# Patient Record
Sex: Male | Born: 1945
Health system: Southern US, Community
[De-identification: ages and names within clinical notes are randomized; demographics above are authoritative.]

## PROBLEM LIST (undated history)

## (undated) DIAGNOSIS — K469 Unspecified abdominal hernia without obstruction or gangrene: Secondary | ICD-10-CM

## (undated) DIAGNOSIS — M1990 Unspecified osteoarthritis, unspecified site: Secondary | ICD-10-CM

## (undated) DIAGNOSIS — C801 Malignant (primary) neoplasm, unspecified: Secondary | ICD-10-CM

## (undated) HISTORY — PX: HERNIA REPAIR: SHX51

## (undated) HISTORY — DX: Unspecified osteoarthritis, unspecified site: M19.90

## (undated) HISTORY — DX: Unspecified abdominal hernia without obstruction or gangrene: K46.9

## (undated) HISTORY — DX: Malignant (primary) neoplasm, unspecified: C80.1

---

## 2000-11-28 ENCOUNTER — Encounter (INDEPENDENT_AMBULATORY_CARE_PROVIDER_SITE_OTHER): Payer: Self-pay | Admitting: Specialist

## 2000-11-28 ENCOUNTER — Ambulatory Visit (HOSPITAL_BASED_OUTPATIENT_CLINIC_OR_DEPARTMENT_OTHER): Admission: RE | Admit: 2000-11-28 | Discharge: 2000-11-28 | Payer: Self-pay | Admitting: Plastic Surgery

## 2003-04-02 ENCOUNTER — Ambulatory Visit (HOSPITAL_COMMUNITY): Admission: RE | Admit: 2003-04-02 | Discharge: 2003-04-02 | Payer: Self-pay | Admitting: Gastroenterology

## 2003-05-28 ENCOUNTER — Encounter: Payer: Self-pay | Admitting: Internal Medicine

## 2003-05-28 ENCOUNTER — Encounter: Admission: RE | Admit: 2003-05-28 | Discharge: 2003-05-28 | Payer: Self-pay | Admitting: Internal Medicine

## 2003-12-26 ENCOUNTER — Encounter: Admission: RE | Admit: 2003-12-26 | Discharge: 2003-12-26 | Payer: Self-pay | Admitting: Internal Medicine

## 2009-07-21 ENCOUNTER — Ambulatory Visit: Admission: RE | Admit: 2009-07-21 | Discharge: 2009-08-05 | Payer: Self-pay | Admitting: Radiation Oncology

## 2009-08-03 ENCOUNTER — Encounter: Admission: RE | Admit: 2009-08-03 | Discharge: 2009-08-03 | Payer: Self-pay | Admitting: Internal Medicine

## 2009-08-07 ENCOUNTER — Encounter: Admission: RE | Admit: 2009-08-07 | Discharge: 2009-08-07 | Payer: Self-pay | Admitting: Internal Medicine

## 2009-09-17 ENCOUNTER — Ambulatory Visit: Admission: RE | Admit: 2009-09-17 | Discharge: 2009-11-18 | Payer: Self-pay | Admitting: Radiation Oncology

## 2009-10-21 HISTORY — PX: INSERTION PROSTATE RADIATION SEED: SUR718

## 2009-11-02 ENCOUNTER — Ambulatory Visit (HOSPITAL_BASED_OUTPATIENT_CLINIC_OR_DEPARTMENT_OTHER): Admission: RE | Admit: 2009-11-02 | Discharge: 2009-11-02 | Payer: Self-pay | Admitting: Urology

## 2009-12-15 ENCOUNTER — Ambulatory Visit: Admission: RE | Admit: 2009-12-15 | Discharge: 2009-12-25 | Payer: Self-pay | Admitting: Radiation Oncology

## 2011-02-22 LAB — CBC
MCHC: 33.8 g/dL (ref 30.0–36.0)
Platelets: 176 10*3/uL (ref 150–400)
RBC: 4.41 MIL/uL (ref 4.22–5.81)

## 2011-02-22 LAB — COMPREHENSIVE METABOLIC PANEL
ALT: 17 U/L (ref 0–53)
AST: 18 U/L (ref 0–37)
Albumin: 3.7 g/dL (ref 3.5–5.2)
CO2: 26 mEq/L (ref 19–32)
Calcium: 8.9 mg/dL (ref 8.4–10.5)
Creatinine, Ser: 1.01 mg/dL (ref 0.4–1.5)
GFR calc Af Amer: >60 mL/min (ref >60)
GFR calc non Af Amer: >60 mL/min (ref >60)
Sodium: 140 mEq/L (ref 135–145)
Total Protein: 6.2 g/dL (ref 6.0–8.3)

## 2011-02-22 LAB — APTT: aPTT: 29 seconds (ref 24–37)

## 2011-04-08 NOTE — Op Note (Signed)
[place]  Patient:    [name], [name]                        MRN: [id number] Proc. Date: 11/28/00 Attending:  Mary A. Contogiannis, M.D. CC:         Hope M. Danella Deis, M.D.   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  SURGEON:  Mary A. Contogiannis, M.D.  ANESTHESIA:  Lidocaine 1% with epinephrine.  COMPLICATIONS:  None.  INDICATIONS:  The patient is a 65 year old Caucasian male who was referred to my office by Dr. Theodora Blow. Danella Deis for re-excision of a squamous cell carcinoma in situ of the left ear.  She biopsied the lesion, and the pathology indicated that it was a squamous cell carcinoma in situ.  He requested that I proceed with a re-excision at this time.  I will obtain an intraoperative frozen section diagnosis with the pathologist for consultation regarding excision of the lesion with clear margins.  DESCRIPTION OF PROCEDURE:  The patient was brought to the operating room and placed on the operating room table in the supine position.  The skin of the left face and ear were prepped with Betadine and draped in a sterile fashion. The skin and subcutaneous tissues were then injected with 1% lidocaine with epinephrine around the area of the squamous cell carcinoma in situ of the left ear.  After adequate hemostasis and anesthesia had taken effect, the procedure was begun.  The skin cancer was then excised in an elliptical fashion, full-thickness through the skin, allowing a few mm of what appeared to clinically be clear and normal skin around the lesion.  The specimen was carried full-thickness through the skin into the subcutaneous fatty layer.  It was then marked at the 12 oclock position  and sent to the pathology laboratory for intraoperative frozen section diagnosis.  After consulting with the pathologist, he felt that all of the squamous cell carcinoma had been removed, and that all margins were free of tumor.  At this point, the skin edges were then undermined for easy closure.  Meticulous hemostasis was obtained.  The skin edges were then closed using #5-0 Monocryl interrupted subdermal layer, followed by a #6-0 Prolene running baseball stitch on the skin.  The incision was dressed with bacitracin ointment and a Band-Aid. There were no complications.  The patient tolerated the procedure well.  The final needle and sponge counts were reported to be correct at the end of the case.  The patient was then discharged home in stable condition, and will come to the office tomorrow for a follow-up appointment. DD:  11/28/00 TD:  11/28/00 Job: [id number]

## 2011-04-08 NOTE — Op Note (Signed)
   NAMECRUISE, BAUMGARDNER                    ACCOUNT NO.:  [id number]   MEDICAL RECORD NO.:  [id number]                   PATIENT TYPE:  AMB   LOCATION:  ENDO                                 FACILITY:  [facility]   PHYSICIAN:  Danise Edge, M.D.                DATE OF BIRTH:  [birth year]   DATE OF PROCEDURE:  04/02/2003  DATE OF DISCHARGE:                                 OPERATIVE REPORT   PROCEDURE:  Screening colonoscopy.   INDICATIONS FOR PROCEDURE:  Mr. Taeshaun Rames is a 65 year old male  born 24-May-1946. Mr. Theiler father was diagnosed with colon cancer at  age 28 and died of colon cancer at age 6. Mr. Wigley is scheduled to  undergo his second screening colonoscopy with polypectomy to prevent colon  cancer.   ENDOSCOPIST:  Charolett Bumpers, M.D.   PREMEDICATION:  Versed 7.5 mg, Demerol 50 mg .   DESCRIPTION OF PROCEDURE:  After obtaining informed consent, Mr. Pickrell  was  placed in the left lateral decubitus position. I administered intravenous  Demerol and intravenous Versed to achieve conscious sedation for the  procedure. The patient's blood pressure, oxygen saturation and cardiac  rhythm were monitored throughout the procedure and documented in the medical  record.   Anal inspection was normal. Digital rectal exam revealed a nonnodular  prostate. The Olympus adult colonoscope was introduced into the rectum and  easily advanced to the cecum. Colonic preparation for the exam today was  excellent.   RECTUM:  Normal.   SIGMOID COLON AND DESCENDING COLON:  Normal.   SPLENIC FLEXURE:  Normal.   TRANSVERSE COLON:  Normal.   HEPATIC FLEXURE:  Normal.   ASCENDING COLON:  Normal.   CECUM AND ILEOCECAL VALVE:  Normal.   ASSESSMENT:  Normal screening proctocolonoscopy to the cecum.   RECOMMENDATIONS:  Repeat colonoscopy in five years.                                              Danise Edge, M.D.   MJ/MEDQ  D:  04/02/2003  T:  04/02/2003  Job:   [id number]

## 2011-04-08 NOTE — Op Note (Signed)
[place]  Patient:    KARLA, VINES                        MRN: [id number] Proc. Date: 11/28/00 Attending:  Mary A. Contogiannis, M.D.                           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  PROCEDURE:  Excision of 0.5 cm squamous cell carcinoma in situ DD:  11/28/00 TD:  11/28/00 Job: [id number]

## 2011-04-22 HISTORY — PX: KNEE CARTILAGE SURGERY: SHX688

## 2011-09-09 ENCOUNTER — Emergency Department (HOSPITAL_COMMUNITY)
Admission: EM | Admit: 2011-09-09 | Discharge: 2011-09-09 | Disposition: A | Discharge status: Home / Self Care | Payer: Medicare Other | Attending: Emergency Medicine | Admitting: Emergency Medicine

## 2011-09-09 DIAGNOSIS — Z8546 Personal history of malignant neoplasm of prostate: Secondary | ICD-10-CM | POA: Insufficient documentation

## 2011-09-09 DIAGNOSIS — K4090 Unilateral inguinal hernia, without obstruction or gangrene, not specified as recurrent: Secondary | ICD-10-CM | POA: Insufficient documentation

## 2011-09-09 DIAGNOSIS — R1032 Left lower quadrant pain: Secondary | ICD-10-CM

## 2011-09-10 NOTE — Consult Note (Signed)
[name], RABORN NO.:  [id number]  MEDICAL RECORD NO.:  [id number]  LOCATION:  WLED                         FACILITY:  [facility]  PHYSICIAN:  Adolph Pollack, M.D.DATE OF BIRTH:  05/13/1946  DATE OF CONSULTATION:  09/09/2011 DATE OF DISCHARGE:  09/09/2011                                CONSULTATION   REQUESTING PHYSICIAN:  Lavinia Sharps, NP  REASON:  Incarcerated left inguinal hernia.  HISTORY:  Mr. Poulson is a 65 year old male who has been having intermittent pains in the left groin.  About 3 months ago, he had a cough and felt a straining sensation in the left groin.  Two weeks ago, he had some left lower quadrant abdominal pain that then radiated down to the left groin area.  He had some swelling that would come and go. It was not there in the morning, but as he went throughout the day, it came more pronounced.  About a week ago, he had a severe episode of sharp left groin pain and swelling.  That has since improved.  He is having normal bowel movements and is not having any problems with urinating.  However, he still has some discomfort and swelling in the left groin.  He went to saw Mee Hives, nurse practitioner today and there was concern for an incarcerated hernia in left inguinal area, and he was sent to the emergency department and we were asked to see him. He currently is sitting comfortably in the emergency room and is in no acute distress.  PAST MEDICAL HISTORY: 1. Mild sleep apnea. 2. Nephrolithiasis. 3. Arthritis. 4. Prostate cancer. 5. BPH.  PREVIOUS OPERATIONS: 1. Left knee arthroscopy. 2. Seed implantation of the prostate gland.  ALLERGIES:  None.  MEDICATIONS: 1. Multivitamin. 2. Ibuprofen. 3. Tamsulosin. 4. Levitra. 5. Fish oil. 6. Dicyclomine (was given this by Urgent Care as they thought he might     have some diverticulitis).  SOCIAL HISTORY:  He is married.  His wife just had a hysterectomy.  He travels a lot  for work.  Denies tobacco or alcohol use.  FAMILY HISTORY:  Not contributory to this situation.  REVIEW OF SYSTEMS:  GENERAL:  He has felt well other than having these left groin pain episodes.  CARDIOVASCULAR:  He denies any heart disease, hypertension.  PULMONARY:  He denies any current cough, asthma.  GI:  He denies known diverticulitis, hepatitis, or peptic ulcer disease.  GU: He does have 3 times nocturia, although he states he does not have to strain to urinate.  HEMATOLOGIC:  No bleeding source or blood clots. NEUROLOGIC:  No strokes or seizures.  PHYSICAL EXAMINATION:  GENERAL:  Well-developed, well-nourished male in no acute distress, pleasant and cooperative. VITAL SIGNS:  Temperature is 98.4 degrees, blood pressure is 131/66, pulse 77, respiratory rate 20. RESPIRATORY:  Breath sounds are equal and clear.  Respirations unlabored. CARDIOVASCULAR:  Regular rate and rhythm.  No murmurs. ABDOMEN:  Soft.  No umbilical hernia present.  No tenderness. GU:  There is a left inguinal bulge that is mildly sensitive, but it is reducible.  Right inguinal floor is solid.  No testicular lesions.  IMPRESSION:  Symptomatic simple left  inguinal hernia.  No evidence of incarceration.  PLAN:  I had a long discussion with him and recommended an open left inguinal hernia with mesh.  Until that time, I told him that he should be on very light activities with a 10-pound weight restriction.  I told him if it was symptomatic ideally, he would have this done within 1 to 2 weeks.  I gave him my number to call and schedule surgery after he has discussed this with his wife and his work.  Also, of note is he has appointment with Dr. Danise Edge on Monday and Dr. Theressa Millard next week as well.  We discussed the procedure of umbilical hernia repair at length.  The risks include, but not limited to bleeding, infection, wound healing problems, anesthesia, recurrence, accidental injury to  intraabdominal organs, neuropathic pain, testicular ischemia, and urinary retention as well as the recurrence rate.  He seems to understand all this.  He is interested in having it repaired and said he will call the office and schedule it.     Adolph Pollack, M.D.     Kari Baars  D:  09/09/2011  T:  09/09/2011  Job:  [id number]  cc:   [address] [place] Fax: [phone]  Theressa Millard, M.D. Fax: [phone]  Electronically Signed by Avel Peace M.D. on 09/10/2011 01:07:33 AM

## 2011-09-13 ENCOUNTER — Ambulatory Visit (INDEPENDENT_AMBULATORY_CARE_PROVIDER_SITE_OTHER): Payer: Medicare Other | Admitting: General Surgery

## 2011-09-13 ENCOUNTER — Ambulatory Visit (INDEPENDENT_AMBULATORY_CARE_PROVIDER_SITE_OTHER): Payer: BC Managed Care – PPO | Admitting: General Surgery

## 2011-09-13 ENCOUNTER — Encounter (INDEPENDENT_AMBULATORY_CARE_PROVIDER_SITE_OTHER): Payer: Self-pay | Admitting: General Surgery

## 2011-09-13 VITALS — BP 108/76 | HR 64 | Temp 97.0°F | Resp 20 | Ht 72.0 in | Wt 178.2 lb

## 2011-09-13 DIAGNOSIS — K4090 Unilateral inguinal hernia, without obstruction or gangrene, not specified as recurrent: Secondary | ICD-10-CM | POA: Insufficient documentation

## 2011-09-13 NOTE — Progress Notes (Signed)
[name] is here for a preoperative visit. He is interested in scheduling his left inguinal hernia. I discussed with an open repair versus laparoscopic and recommended the open repair. He is in agreement with this. He has been given literature on the repair as well as aftercare instructions. We will proceed with scheduling his surgery today.

## 2011-10-05 ENCOUNTER — Encounter (HOSPITAL_COMMUNITY): Payer: Self-pay | Admitting: Pharmacy Technician

## 2011-10-06 ENCOUNTER — Encounter (HOSPITAL_COMMUNITY)
Admission: RE | Admit: 2011-10-06 | Discharge: 2011-10-06 | Disposition: A | Discharge status: Home / Self Care | Payer: Medicare Other | Source: Ambulatory Visit | Attending: General Surgery | Admitting: General Surgery

## 2011-10-06 ENCOUNTER — Other Ambulatory Visit: Payer: Self-pay

## 2011-10-06 ENCOUNTER — Encounter (HOSPITAL_COMMUNITY): Payer: Self-pay

## 2011-10-06 LAB — CBC
HCT: 44.3 % (ref 39.0–52.0)
Hemoglobin: 15.4 g/dL (ref 13.0–17.0)
MCH: 30.1 pg (ref 26.0–34.0)
MCHC: 34.8 g/dL (ref 30.0–36.0)
RDW: 12.9 % (ref 11.5–15.5)

## 2011-10-06 LAB — COMPREHENSIVE METABOLIC PANEL
BUN: 15 mg/dL (ref 6–23)
Calcium: 10.2 mg/dL (ref 8.4–10.5)
Creatinine, Ser: 0.93 mg/dL (ref 0.50–1.35)
GFR calc Af Amer: >90 mL/min (ref >90)
Glucose, Bld: 73 mg/dL (ref 70–99)
Total Protein: 7.2 g/dL (ref 6.0–8.3)

## 2011-10-06 LAB — DIFFERENTIAL
Eosinophils Absolute: 0.1 10*3/uL (ref 0.0–0.7)
Lymphocytes Relative: 24 % (ref 12–46)
Lymphs Abs: 1.3 10*3/uL (ref 0.7–4.0)
Neutro Abs: 3.5 10*3/uL (ref 1.7–7.7)
Neutrophils Relative %: 65 % (ref 43–77)

## 2011-10-06 LAB — PROTIME-INR
INR: 1.01 (ref 0.00–1.49)
Prothrombin Time: 13.5 seconds (ref 11.6–15.2)

## 2011-10-06 LAB — SURGICAL PCR SCREEN: MRSA, PCR: NEGATIVE

## 2011-10-06 MED ORDER — CEFAZOLIN SODIUM 1-5 GM-% IV SOLN
1.0000 g | INTRAVENOUS | Status: AC
  Administered 2011-10-07: 1 g via INTRAVENOUS
  Filled 2011-10-06 (×2): qty 50

## 2011-10-06 NOTE — Pre-Procedure Instructions (Deleted)
20 FODAY CONE  10/06/2011   Your procedure is scheduled on:  10-07-2011  Report to [name] at 11:00 AM  Call this number if you have problems the morning of surgery: [phone]   Remember:   Do not eat food:After Midnight.  Do not drink clear liquids: 4 Hours before arrival  TEA,SODA,BROTH,BLACK COFFEE,APPLE JUICE, GRAPE JUICE UNTIL 7:00 AM.  Take these medicines the morning of surgery with A SIP OF WATER: FLOMAX   Do not wear jewelry, make-up or nail polish.  Do not wear lotions, powders, or perfumes. You may wear deodorant.  Do not shave 48 hours prior to surgery.  Do not bring valuables to the hospital.  Contacts, dentures or bridgework may not be worn into surgery.  Leave suitcase in the car. After surgery it may be brought to your room.  For patients admitted to the hospital, checkout time is 11:00 AM the day of discharge.   Patients discharged the day of surgery will not be allowed to drive home.  Name and phone number of your driver:  Special Instructions: CHG Shower Use Special Wash: 1/2 bottle night before surgery and 1/2 bottle morning of surgery.   Please read over the following fact sheets that you were given: Coughing and Deep Breathing, MRSA Information and Surgical Site Infection Prevention

## 2011-10-06 NOTE — Pre-Procedure Instructions (Signed)
20 [name]  10/06/2011   Your procedure is scheduled on:10-07-2011  Report to [name] at 11 AM.  Call this number if you have problems the morning of surgery: [phone]   Remember:   Do not eat food:After Midnight.  Do not drink clear liquids: 4 Hours before arrival.  TEA,SODA,BROTH,BLACK COFFEE,APPLE JUICE,GRAPE JUICE BEFORE 7 AM  Take these medicines the morning of surgery with A SIP OF WATER FLOMAX   Do not wear jewelry, make-up or nail polish.  Do not wear lotions, powders, or perfumes. You may wear deodorant.  Do not shave 48 hours prior to surgery.  Do not bring valuables to the hospital.  Contacts, dentures or bridgework may not be worn into surgery.  Leave suitcase in the car. After surgery it may be brought to your room.  For patients admitted to the hospital, checkout time is 11:00 AM the day of discharge.   Patients discharged the day of surgery will not be allowed to drive home.  Name and phone number of your driver:   Special Instructions: CHG Shower Use Special Wash: 1/2 bottle night before surgery and 1/2 bottle morning of surgery.   Please read over the following fact sheets that you were given: Coughing and Deep Breathing, MRSA Information and Surgical Site Infection Prevention

## 2011-10-07 ENCOUNTER — Ambulatory Visit (HOSPITAL_COMMUNITY)
Admission: RE | Admit: 2011-10-07 | Discharge: 2011-10-07 | Disposition: A | Discharge status: Home / Self Care | Payer: Medicare Other | Source: Ambulatory Visit | Attending: General Surgery | Admitting: General Surgery

## 2011-10-07 ENCOUNTER — Encounter (HOSPITAL_COMMUNITY): Payer: Self-pay | Admitting: *Deleted

## 2011-10-07 ENCOUNTER — Encounter (HOSPITAL_COMMUNITY): Payer: Self-pay | Admitting: Anesthesiology

## 2011-10-07 ENCOUNTER — Other Ambulatory Visit (INDEPENDENT_AMBULATORY_CARE_PROVIDER_SITE_OTHER): Payer: Self-pay | Admitting: General Surgery

## 2011-10-07 ENCOUNTER — Ambulatory Visit (HOSPITAL_COMMUNITY): Payer: Medicare Other | Admitting: Anesthesiology

## 2011-10-07 ENCOUNTER — Encounter (HOSPITAL_COMMUNITY)
Admission: RE | Disposition: A | Discharge status: Home / Self Care | Payer: Self-pay | Source: Ambulatory Visit | Attending: General Surgery

## 2011-10-07 DIAGNOSIS — K4090 Unilateral inguinal hernia, without obstruction or gangrene, not specified as recurrent: Secondary | ICD-10-CM

## 2011-10-07 DIAGNOSIS — Z01818 Encounter for other preprocedural examination: Secondary | ICD-10-CM | POA: Insufficient documentation

## 2011-10-07 DIAGNOSIS — Z01812 Encounter for preprocedural laboratory examination: Secondary | ICD-10-CM | POA: Insufficient documentation

## 2011-10-07 DIAGNOSIS — Z01810 Encounter for preprocedural cardiovascular examination: Secondary | ICD-10-CM | POA: Insufficient documentation

## 2011-10-07 HISTORY — PX: INGUINAL HERNIA REPAIR: SHX194

## 2011-10-07 SURGERY — REPAIR, HERNIA, INGUINAL, ADULT
Anesthesia: General | Site: Groin | Laterality: Left | Wound class: Clean

## 2011-10-07 MED ORDER — MEPERIDINE HCL 25 MG/ML IJ SOLN
6.2500 mg | INTRAMUSCULAR | Status: DC | PRN
Start: 2011-10-07 — End: 2011-10-07

## 2011-10-07 MED ORDER — PROMETHAZINE HCL 25 MG/ML IJ SOLN
12.5000 mg | Freq: Four times a day (QID) | INTRAMUSCULAR | Status: DC | PRN
  Filled 2011-10-07: qty 1

## 2011-10-07 MED ORDER — HYDROMORPHONE HCL PF 1 MG/ML IJ SOLN
0.2500 mg | INTRAMUSCULAR | Status: DC | PRN
  Administered 2011-10-07: 0.5 mg via INTRAVENOUS

## 2011-10-07 MED ORDER — SODIUM CHLORIDE 0.9 % IR SOLN
Status: DC | PRN
  Administered 2011-10-07: 1

## 2011-10-07 MED ORDER — BUPIVACAINE LIPOSOME 1.3 % IJ SUSP
20.0000 mL | Freq: Once | INTRAMUSCULAR | Status: AC
  Administered 2011-10-07: 30 mL
  Filled 2011-10-07: qty 20

## 2011-10-07 MED ORDER — MIDAZOLAM HCL 5 MG/5ML IJ SOLN
INTRAMUSCULAR | Status: DC | PRN
  Administered 2011-10-07: 2 mg via INTRAVENOUS

## 2011-10-07 MED ORDER — ONDANSETRON HCL 4 MG/2ML IJ SOLN
4.0000 mg | Freq: Four times a day (QID) | INTRAMUSCULAR | Status: DC | PRN
  Filled 2011-10-07: qty 2

## 2011-10-07 MED ORDER — LACTATED RINGERS IV SOLN
INTRAVENOUS | Status: DC | PRN
  Administered 2011-10-07 (×2): via INTRAVENOUS

## 2011-10-07 MED ORDER — MORPHINE SULFATE 2 MG/ML IJ SOLN: 0.0500 mg/kg | INTRAMUSCULAR | Status: DC | PRN

## 2011-10-07 MED ORDER — MIDAZOLAM HCL 2 MG/2ML IJ SOLN
0.5000 mg | Freq: Once | INTRAMUSCULAR | Status: DC | PRN
Start: 2011-10-07 — End: 2011-10-07

## 2011-10-07 MED ORDER — OXYCODONE-ACETAMINOPHEN 5-325 MG PO TABS: 1.0000 | ORAL_TABLET | ORAL | Status: DC | PRN

## 2011-10-07 MED ORDER — PHENYLEPHRINE HCL 10 MG/ML IJ SOLN
INTRAMUSCULAR | Status: DC | PRN
  Administered 2011-10-07 (×2): 80 ug via INTRAVENOUS

## 2011-10-07 MED ORDER — PROMETHAZINE HCL 25 MG/ML IJ SOLN: 6.2500 mg | INTRAMUSCULAR | Status: DC | PRN

## 2011-10-07 MED ORDER — FENTANYL CITRATE 0.05 MG/ML IJ SOLN
INTRAMUSCULAR | Status: DC | PRN
  Administered 2011-10-07: 100 ug via INTRAVENOUS
  Administered 2011-10-07: 150 ug via INTRAVENOUS

## 2011-10-07 MED ORDER — PROPOFOL 10 MG/ML IV EMUL
INTRAVENOUS | Status: DC | PRN
  Administered 2011-10-07: 200 mg via INTRAVENOUS

## 2011-10-07 SURGICAL SUPPLY — 52 items
APL SKNCLS STERI-STRIP NONHPOA (GAUZE/BANDAGES/DRESSINGS) ×1
BENZOIN TINCTURE PRP APPL 2/3 (GAUZE/BANDAGES/DRESSINGS) ×2 IMPLANT
BLADE SURG 10 STRL SS (BLADE) ×2 IMPLANT
BLADE SURG 15 STRL LF DISP TIS (BLADE) ×1 IMPLANT
BLADE SURG 15 STRL SS (BLADE) ×2
BLADE SURG ROTATE 9660 (MISCELLANEOUS) IMPLANT
CHLORAPREP W/TINT 26ML (MISCELLANEOUS) ×2 IMPLANT
CLOTH BEACON ORANGE TIMEOUT ST (SAFETY) ×2 IMPLANT
COVER SURGICAL LIGHT HANDLE (MISCELLANEOUS) ×2 IMPLANT
DRAIN PENROSE 1/2X12 LTX STRL (WOUND CARE) ×1 IMPLANT
DRAPE INCISE IOBAN 66X45 STRL (DRAPES) ×2 IMPLANT
DRAPE LAPAROTOMY TRNSV 102X78 (DRAPE) ×2 IMPLANT
DRAPE UTILITY 15X26 W/TAPE STR (DRAPE) ×4 IMPLANT
DRESSING TELFA 8X3 (GAUZE/BANDAGES/DRESSINGS) ×2 IMPLANT
DRSG OPSITE 6X11 MED (GAUZE/BANDAGES/DRESSINGS) ×2 IMPLANT
ELECT CAUTERY BLADE 6.4 (BLADE) ×2 IMPLANT
ELECT REM PT RETURN 9FT ADLT (ELECTROSURGICAL) ×2
ELECTRODE REM PT RTRN 9FT ADLT (ELECTROSURGICAL) ×1 IMPLANT
GLOVE BIO SURGEON STRL SZ7.5 (GLOVE) ×1 IMPLANT
GLOVE BIOGEL PI IND STRL 7.0 (GLOVE) IMPLANT
GLOVE BIOGEL PI IND STRL 7.5 (GLOVE) IMPLANT
GLOVE BIOGEL PI IND STRL 8 (GLOVE) ×1 IMPLANT
GLOVE BIOGEL PI INDICATOR 7.0 (GLOVE) ×1
GLOVE BIOGEL PI INDICATOR 7.5 (GLOVE) ×2
GLOVE BIOGEL PI INDICATOR 8 (GLOVE) ×1
GLOVE ECLIPSE 8.0 STRL XLNG CF (GLOVE) ×2 IMPLANT
GLOVE SS BIOGEL STRL SZ 6.5 (GLOVE) IMPLANT
GLOVE SUPERSENSE BIOGEL SZ 6.5 (GLOVE) ×1
GOWN STRL NON-REIN LRG LVL3 (GOWN DISPOSABLE) ×6 IMPLANT
KIT BASIN OR (CUSTOM PROCEDURE TRAY) ×2 IMPLANT
KIT ROOM TURNOVER OR (KITS) ×2 IMPLANT
MESH HERNIA 3X6 (Mesh General) ×1 IMPLANT
NDL HYPO 25GX1X1/2 BEV (NEEDLE) ×1 IMPLANT
NEEDLE HYPO 25GX1X1/2 BEV (NEEDLE) ×2 IMPLANT
NS IRRIG 1000ML POUR BTL (IV SOLUTION) ×2 IMPLANT
PACK SURGICAL SETUP 50X90 (CUSTOM PROCEDURE TRAY) ×2 IMPLANT
PAD ARMBOARD 7.5X6 YLW CONV (MISCELLANEOUS) ×2 IMPLANT
PENCIL BUTTON HOLSTER BLD 10FT (ELECTRODE) ×2 IMPLANT
SPECIMEN JAR SMALL (MISCELLANEOUS) IMPLANT
SPONGE LAP 18X18 X RAY DECT (DISPOSABLE) ×2 IMPLANT
STRIP CLOSURE SKIN 1/2X4 (GAUZE/BANDAGES/DRESSINGS) ×2 IMPLANT
SUT MON AB 4-0 PC3 18 (SUTURE) ×2 IMPLANT
SUT PROLENE 2 0 CT2 30 (SUTURE) ×4 IMPLANT
SUT SILK 2 0 SH (SUTURE) IMPLANT
SUT VIC AB 2-0 SH 18 (SUTURE) ×2 IMPLANT
SUT VIC AB 3-0 SH 27 (SUTURE) ×2
SUT VIC AB 3-0 SH 27XBRD (SUTURE) ×1 IMPLANT
SUT VICRYL AB 3 0 TIES (SUTURE) ×2 IMPLANT
SYR CONTROL 10ML LL (SYRINGE) ×2 IMPLANT
TOWEL OR 17X24 6PK STRL BLUE (TOWEL DISPOSABLE) ×2 IMPLANT
TOWEL OR 17X26 10 PK STRL BLUE (TOWEL DISPOSABLE) ×2 IMPLANT
WATER STERILE IRR 1000ML POUR (IV SOLUTION) IMPLANT

## 2011-10-07 NOTE — Anesthesia Postprocedure Evaluation (Signed)
  Anesthesia Post-op Note  Patient: [name]  Procedure(s) Performed:  HERNIA REPAIR INGUINAL ADULT - Left Inguinal Repair with Mesh  Patient Location: PACU  Anesthesia Type: General  Level of Consciousness: awake  Airway and Oxygen Therapy: Patient Spontanous Breathing  Post-op Pain: mild  Post-op Assessment: Post-op Vital signs reviewed  Post-op Vital Signs: stable  Complications: No apparent anesthesia complications

## 2011-10-07 NOTE — H&P (Signed)
           Patient Information       Patient Name  Sex  DOB  SSN    [name]  Male  04-Apr-1946  [id number]          Transcription       Type  ID  Status  Clydene Fake & P [id number]  Authenticated  Adolph Pollack, MD                 Transcription Text    NAME: AN, SCHNABEL.: [id number]  MEDICAL RECORD NO.: [id number]   PHYSICIAN: Adolph Pollack, M.D.DATE OF BIRTH: 24-Feb-1946   REASON: Left inguinal hernia.   HISTORY: [name] is a 65 year old male who has been having  intermittent pains in the left groin. About 4 months ago, he had a  cough and felt a straining sensation in the left groin.   He had an episode of significant pain about a month ago and was seen in the ED where he was found to have a reducible LIH.  He now presents for elective repair. PAST MEDICAL HISTORY:  1. Mild sleep apnea.  2. Nephrolithiasis.  3. Arthritis.  4. Prostate cancer.  5. BPH.  PREVIOUS OPERATIONS:  1. Left knee arthroscopy.  2. Seed implantation of the prostate gland.  ALLERGIES: None.  MEDICATIONS: Reviewed  SOCIAL HISTORY: He is married. His wife just had a hysterectomy. He  travels a lot for work. Denies tobacco or alcohol use.  FAMILY HISTORY: Not contributory to this situation.  REVIEW OF SYSTEMS: GENERAL: He has felt well other than having these  left groin pain episodes. CARDIOVASCULAR: He denies any heart disease,  hypertension. PULMONARY: He denies any current cough, asthma. GI: He  denies known diverticulitis, hepatitis, or peptic ulcer disease. GU:  He does have 3 times nocturia, although he states he does not have to  strain to urinate. HEMATOLOGIC: No bleeding source or blood clots.  NEUROLOGIC: No strokes or seizures.  PHYSICAL EXAMINATION: GENERAL: Well-developed, well-nourished male in  no acute distress, pleasant and cooperative.  VITAL SIGNS: Temperature is 98.4 degrees, blood pressure is 131/66,  pulse 77, respiratory rate 20.    RESPIRATORY: Breath sounds are equal and clear. Respirations  unlabored.  CARDIOVASCULAR: Regular rate and rhythm. No murmurs.  ABDOMEN: Soft. No umbilical hernia present. No tenderness.  GU: There is a left inguinal bulge that is mildly sensitive, but it is  reducible. Right inguinal floor is solid. No testicular lesions.  IMPRESSION: Symptomatic simple left inguinal hernia. No evidence of  incarceration.  PLAN: Open left inguinal hernia with mesh.  We discussed the procedure of inguinal hernia repair at length. The  risks include, but not limited to bleeding, infection, wound healing  problems, anesthesia, recurrence, accidental injury to intraabdominal  organs, neuropathic pain, testicular ischemia, and urinary retention as  well as the recurrence rate. He seems to understand and agrees. Adolph Pollack, M.D.

## 2011-10-07 NOTE — Transfer of Care (Signed)
Immediate Anesthesia Transfer of Care Note  Patient: [name]  Procedure(s) Performed:  HERNIA REPAIR INGUINAL ADULT - Left Inguinal Repair with Mesh  Patient Location: PACU  Anesthesia Type: General  Level of Consciousness: awake, alert , oriented and patient cooperative  Airway & Oxygen Therapy: Patient Spontanous Breathing and Patient connected to nasal cannula oxygen  Post-op Assessment: Report given to PACU RN, Post -op Vital signs reviewed and stable and Patient moving all extremities  Post vital signs: Reviewed and stable  Complications: No apparent anesthesia complications

## 2011-10-07 NOTE — Op Note (Signed)
Preoperative diagnosis:  Left inguinal hernia.  Postoperative diagnosis:  Same  Procedure:  Left inguinal hernia repair with mesh.  Surgeon:  Avel Peace, M.D.  Anesthesia:  General/LMA with local (Exparel).  Indication:  [name] is a 65 year old man with a painful left inguinal hernia.  He presents for repair.  The procedure, risks, and aftercare were discussed with him preop.  Technique:  The patient was seen in the holding room and the left groin was marked with my initials. The patient was brought to the operating, placed supine on the operating table, and the anesthetic was administered by the anesthesiologist. The hair in the groin area was clipped as was felt to be necessary. This area was then sterilely prepped and draped.  Local anesthetic was infiltrated in the superficial and deep tissues in the left groin.  An incision was made through the skin and subcutaneous tissue until the external oblique aponeurosis was identified.  Local anesthetic was infiltrated deep to the external oblique aponeurosis. The external oblique aponeurosis was divided through the external ring medially and back toward the anterior superior iliac spine laterally. Using blunt dissection, the shelving edge of the inguinal ligament was identified inferiorly and the internal oblique aponeurosis and muscle were identified superiorly. The ilioinguinal nerve was identified and preserved.  The spermatic cord was isolated and a posterior window was made around it. An indirect hernia sac was identified and separated from the spermatic cord using blunt dissection. The hernia sac and its contents were reduced the indirect hernia defect.   A piece of 3" x 6" polypropylene mesh was brought into the field and anchored 1-2 cm medial to the pubic tubercle with 2-0 Prolene suture. The inferior aspect of the mesh was anchored to the shelving edge of the inguinal ligament with running 2-0 Prolene suture to a level 1-2 cm  lateral to the internal ring. A slit was cut in the mesh creating 2 tails. These were wrapped around the spermatic cord. The superior aspect of the mesh was anchored to the internal oblique aponeurosis and muscle with interrupted 2-0 Vicryl sutures. The 2 tails of the mesh were then crossed creating a new internal ring and were anchored to the shelving edge of the inguinal ligament with 2-0 Prolene suture. The tip of a hemostat could be placed through the new aperture. The lateral aspect of the mesh was then tucked deep to the external oblique aponeurosis.  Local anesthetic was infiltrated into the internal oblique aponeurosis.  The wound was inspected and hemostasis was adequate. The external oblique aponeurosis was then closed over the mesh and cord with running 3-0 Vicryl suture. The subcutaneous tissue was closed with running 3-0 Vicryl suture. The skin closed with a running 4-0 Monocryl subcuticular stitch.  Steri-Strips and a sterile dressing were applied.  The procedure was well-tolerated without any apparent complications and the patient was taken to the recovery room in satisfactory condition.

## 2011-10-07 NOTE — Anesthesia Preprocedure Evaluation (Addendum)
Anesthesia Evaluation  Patient identified by MRN, date of birth, ID band Patient awake    Reviewed: Allergy & Precautions, H&P , NPO status , Patient's Chart, lab work & pertinent test results  Airway       Dental   Pulmonary          Cardiovascular     Neuro/Psych    GI/Hepatic   Endo/Other    Renal/GU      Musculoskeletal   Abdominal   Peds  Hematology   Anesthesia Other Findings   Reproductive/Obstetrics                           Anesthesia Physical Anesthesia Plan  ASA: II  Anesthesia Plan: General   Post-op Pain Management:    Induction: Intravenous  Airway Management Planned: LMA  Additional Equipment:   Intra-op Plan:   Post-operative Plan: Extubation in OR  Informed Consent: I have reviewed the patients History and Physical, chart, labs and discussed the procedure including the risks, benefits and alternatives for the proposed anesthesia with the patient or authorized representative who has indicated his/her understanding and acceptance.   Dental advisory given  Plan Discussed with: CRNA  Anesthesia Plan Comments:         Anesthesia Quick Evaluation

## 2011-10-07 NOTE — Interval H&P Note (Signed)
History and Physical Interval Note:   10/07/2011   1:09 PM   [name]  has presented today for surgery, with the diagnosis of left inguinal hernia   The various methods of treatment have been discussed with the patient and family. After consideration of risks, benefits and other options for treatment, the patient has consented to  Procedure(s): HERNIA REPAIR INGUINAL ADULT as a surgical intervention .  The patients' history has been reviewed, patient examined, no change in status, stable for surgery.  I have reviewed the patients' chart and labs.  Questions were answered to the patient's satisfaction.     Adolph Pollack  MD

## 2011-10-10 ENCOUNTER — Other Ambulatory Visit (INDEPENDENT_AMBULATORY_CARE_PROVIDER_SITE_OTHER): Payer: Self-pay

## 2011-10-10 DIAGNOSIS — K4020 Bilateral inguinal hernia, without obstruction or gangrene, not specified as recurrent: Secondary | ICD-10-CM

## 2011-10-10 NOTE — Telephone Encounter (Signed)
LMOM for pt to call regarding his Rx.

## 2011-10-11 ENCOUNTER — Telehealth (INDEPENDENT_AMBULATORY_CARE_PROVIDER_SITE_OTHER): Payer: Self-pay

## 2011-10-11 ENCOUNTER — Encounter (HOSPITAL_COMMUNITY): Payer: Self-pay | Admitting: General Surgery

## 2011-10-11 MED ORDER — HYDROCODONE-ACETAMINOPHEN 5-325 MG PO TABS: ORAL_TABLET | ORAL | Status: DC

## 2011-10-11 NOTE — Telephone Encounter (Signed)
Called in [employer] 5/325 #30 to [facility], and pt. Is aware.

## 2011-11-28 ENCOUNTER — Ambulatory Visit (INDEPENDENT_AMBULATORY_CARE_PROVIDER_SITE_OTHER): Payer: Medicare Other | Admitting: General Surgery

## 2011-11-28 ENCOUNTER — Encounter (INDEPENDENT_AMBULATORY_CARE_PROVIDER_SITE_OTHER): Payer: Self-pay | Admitting: General Surgery

## 2011-11-28 VITALS — BP 116/84 | HR 72 | Temp 97.6°F | Resp 16 | Ht 72.0 in | Wt 185.8 lb

## 2011-11-28 DIAGNOSIS — Z9889 Other specified postprocedural states: Secondary | ICD-10-CM

## 2011-11-28 NOTE — Progress Notes (Signed)
He presents for postop followup after open left inguinal hernia repair with mesh.  Post op pain was doing well until he developed an upper respiratory infection and significant cough 5 weeks after the operation. Since that time he's had some discomfort when he coughs but not when he does his usual activities. He has not noted any swelling. .  No difficulty voiding or having BMs.    PE:  GU:  Incision clean/dry/intact, swelling is minimal, repair is solid.  Assessment:  Doing well post hernia repair. No evidence of adverse sequelae following his upper respiratory infection and coughing spells.  Plan:  Continue light activities for 6 weeks postop then slowly start to resume normal activities.  Avoid strenous abdominal exercises for a total of 8 weeks from surgery.  Avoid activities that cause significant discomfort for the long term.  Return visit as needed.

## 2011-11-28 NOTE — Patient Instructions (Signed)
Resume normal activities as tolerated

## 2012-03-14 ENCOUNTER — Telehealth (INDEPENDENT_AMBULATORY_CARE_PROVIDER_SITE_OTHER): Payer: Self-pay | Admitting: General Surgery

## 2012-04-09 ENCOUNTER — Telehealth (INDEPENDENT_AMBULATORY_CARE_PROVIDER_SITE_OTHER): Payer: Self-pay | Admitting: General Surgery

## 2012-04-09 ENCOUNTER — Encounter (INDEPENDENT_AMBULATORY_CARE_PROVIDER_SITE_OTHER): Payer: Self-pay | Admitting: General Surgery

## 2012-04-09 ENCOUNTER — Ambulatory Visit (INDEPENDENT_AMBULATORY_CARE_PROVIDER_SITE_OTHER): Payer: Medicare Other | Admitting: General Surgery

## 2012-04-09 VITALS — BP 126/84 | HR 82 | Temp 97.6°F | Resp 16 | Ht 72.0 in | Wt 186.8 lb

## 2012-04-09 DIAGNOSIS — R1032 Left lower quadrant pain: Secondary | ICD-10-CM

## 2012-04-09 NOTE — Progress Notes (Signed)
Subjective:     Patient ID: [name], male   DOB: 05/15/1946, 66 y.o.   MRN: [id number]  HPI This patient follows up 6 months status post open left inguinal hernia repair by Dr. Abbey Chatters. Since his procedure he has had persistent postoperative lower terminal pain and groin pain which she says occurs daily blood is sometimes dull and sore but other times occurs as a sharp pain in the suprapubic region. He has not noticed any recurrent bulge in the area but he does state that his pain is worse with lifting and straining.  Review of Systems     Objective:   Physical Exam His incision is well-healed without any obvious bulges. I do not appreciate any recurrent hernia with Valsalva in either the right or the left    Assessment:     Left groin pain status post open left inguinal hernia repair I do not appreciate any obvious recurrent hernia on exam. This is a difficult problem because I do not appreciate a hernia or any other obvious source. This could be foreign body sensation from the mesh or nerve injury.  However, his symptoms are variable and not localized to a particular point. We discussed the possible causes such as groin strain, and recurrence, nerve injury and I have recommended ultrasound with Valsalva to evaluate for possible recurrence. If the old shunt is negative and we still do not appreciate any recurrence in exam, then I would recommend observation for another few months as this will likely resolve with time. If it doesn't, then I will recommend pain management evaluation.    Plan:     Will check [place] and he will follow up with Dr. Abbey Chatters in another few weeks after his [place].

## 2012-04-13 ENCOUNTER — Other Ambulatory Visit: Payer: Medicare Other

## 2012-04-23 ENCOUNTER — Other Ambulatory Visit: Payer: Medicare Other

## 2012-05-07 ENCOUNTER — Ambulatory Visit (INDEPENDENT_AMBULATORY_CARE_PROVIDER_SITE_OTHER): Payer: Medicare Other | Admitting: General Surgery

## 2012-08-15 ENCOUNTER — Telehealth (INDEPENDENT_AMBULATORY_CARE_PROVIDER_SITE_OTHER): Payer: Self-pay

## 2012-08-15 NOTE — Telephone Encounter (Signed)
Pt would like to be seen by Dr. Magnus Ivan in early October  7th or 8th.  He will have [place] prior to his appt.  He works out of town all week long, and this is the only time he has available.  His cell # is [phone].

## 2012-08-15 NOTE — Telephone Encounter (Signed)
Message sent to me in error.

## 2012-08-16 ENCOUNTER — Telehealth (INDEPENDENT_AMBULATORY_CARE_PROVIDER_SITE_OTHER): Payer: Self-pay

## 2012-08-16 NOTE — Telephone Encounter (Signed)
Left message for patient regarding appointment request dates, need to get approval from Dr. Biagio Quint for 08/27/12 due to Urgent Office availability.  Patient can only come in on 08/27/12 or 08/28/12.  Appointment tentantively scheduled for 08/31/12 until approval rec'd from Dr. Biagio Quint.

## 2012-08-17 ENCOUNTER — Other Ambulatory Visit (INDEPENDENT_AMBULATORY_CARE_PROVIDER_SITE_OTHER): Payer: Self-pay | Admitting: General Surgery

## 2012-08-17 ENCOUNTER — Telehealth (INDEPENDENT_AMBULATORY_CARE_PROVIDER_SITE_OTHER): Payer: Self-pay

## 2012-08-17 DIAGNOSIS — R1032 Left lower quadrant pain: Secondary | ICD-10-CM

## 2012-08-17 NOTE — Telephone Encounter (Signed)
Pt is aware of CT scheduled at [facility] on 08/27/12 at 8:00am.  He is scheduled to see Dr. Biagio Quint at 2:30pm same day which was ok'd by Dr. Biagio Quint.

## 2012-08-27 ENCOUNTER — Encounter (INDEPENDENT_AMBULATORY_CARE_PROVIDER_SITE_OTHER): Payer: Self-pay | Admitting: General Surgery

## 2012-08-27 ENCOUNTER — Ambulatory Visit (INDEPENDENT_AMBULATORY_CARE_PROVIDER_SITE_OTHER): Payer: Medicare Other | Admitting: General Surgery

## 2012-08-27 ENCOUNTER — Ambulatory Visit
Admission: RE | Admit: 2012-08-27 | Discharge: 2012-08-27 | Disposition: A | Discharge status: Home / Self Care | Payer: Medicare Other | Source: Ambulatory Visit | Attending: General Surgery | Admitting: General Surgery

## 2012-08-27 VITALS — BP 124/82 | HR 60 | Temp 98.0°F | Resp 18 | Ht 72.0 in | Wt 184.0 lb

## 2012-08-27 DIAGNOSIS — R1032 Left lower quadrant pain: Secondary | ICD-10-CM

## 2012-08-27 NOTE — Progress Notes (Signed)
Subjective:     Patient ID: [name], male   DOB: 1946/04/21, 66 y.o.   MRN: [id number]  HPI F/u open LIH with mesh by [facility] in November.  He has had some persistent discomfort in the area but is not lifestyle limiting.  He has not noted a bulge in the area.  He had a CT abdomen which did not show any recurrence or any other cause for his pain.    Review of Systems     Objective:   Physical Exam No evidence of recurrent hernia with valsalva. No obvious tenderness on exam    Assessment:     Left groin pain No evidence of recurrent hernia or cause for his pain.  I offered to send him to pain management for possible evaluation but this is not lifestyle limiting and so he wants to hold off on this.      Plan:     F/u prn Dr Abbey Chatters if pain persists

## 2012-08-31 ENCOUNTER — Encounter (INDEPENDENT_AMBULATORY_CARE_PROVIDER_SITE_OTHER): Payer: Medicare Other | Admitting: General Surgery

## 2015-12-16 DIAGNOSIS — N401 Enlarged prostate with lower urinary tract symptoms: Secondary | ICD-10-CM | POA: Diagnosis not present

## 2015-12-16 DIAGNOSIS — Z0000 Encounter for general adult medical examination without abnormal findings: Secondary | ICD-10-CM | POA: Diagnosis not present

## 2015-12-16 DIAGNOSIS — N139 Obstructive and reflux uropathy, unspecified: Secondary | ICD-10-CM | POA: Diagnosis not present

## 2015-12-16 DIAGNOSIS — Z8546 Personal history of malignant neoplasm of prostate: Secondary | ICD-10-CM | POA: Diagnosis not present

## 2015-12-16 DIAGNOSIS — N5201 Erectile dysfunction due to arterial insufficiency: Secondary | ICD-10-CM | POA: Diagnosis not present

## 2015-12-22 DIAGNOSIS — J069 Acute upper respiratory infection, unspecified: Secondary | ICD-10-CM | POA: Diagnosis not present

## 2016-01-01 DIAGNOSIS — Z1389 Encounter for screening for other disorder: Secondary | ICD-10-CM | POA: Diagnosis not present

## 2016-01-01 DIAGNOSIS — Z79899 Other long term (current) drug therapy: Secondary | ICD-10-CM | POA: Diagnosis not present

## 2016-01-01 DIAGNOSIS — M8560 Other cyst of bone, unspecified site: Secondary | ICD-10-CM | POA: Diagnosis not present

## 2016-01-01 DIAGNOSIS — Z0000 Encounter for general adult medical examination without abnormal findings: Secondary | ICD-10-CM | POA: Diagnosis not present

## 2016-05-17 DIAGNOSIS — R1032 Left lower quadrant pain: Secondary | ICD-10-CM | POA: Diagnosis not present

## 2016-07-04 DIAGNOSIS — L821 Other seborrheic keratosis: Secondary | ICD-10-CM | POA: Diagnosis not present

## 2016-07-04 DIAGNOSIS — Z85828 Personal history of other malignant neoplasm of skin: Secondary | ICD-10-CM | POA: Diagnosis not present

## 2016-07-04 DIAGNOSIS — D485 Neoplasm of uncertain behavior of skin: Secondary | ICD-10-CM | POA: Diagnosis not present

## 2016-07-04 DIAGNOSIS — D225 Melanocytic nevi of trunk: Secondary | ICD-10-CM | POA: Diagnosis not present

## 2016-07-04 DIAGNOSIS — L814 Other melanin hyperpigmentation: Secondary | ICD-10-CM | POA: Diagnosis not present

## 2016-07-04 DIAGNOSIS — L570 Actinic keratosis: Secondary | ICD-10-CM | POA: Diagnosis not present

## 2016-09-30 DIAGNOSIS — R102 Pelvic and perineal pain: Secondary | ICD-10-CM | POA: Diagnosis not present

## 2016-10-06 DIAGNOSIS — N50812 Left testicular pain: Secondary | ICD-10-CM | POA: Diagnosis not present

## 2016-10-06 DIAGNOSIS — R278 Other lack of coordination: Secondary | ICD-10-CM | POA: Diagnosis not present

## 2016-10-06 DIAGNOSIS — M62838 Other muscle spasm: Secondary | ICD-10-CM | POA: Diagnosis not present

## 2016-10-06 DIAGNOSIS — R102 Pelvic and perineal pain: Secondary | ICD-10-CM | POA: Diagnosis not present

## 2016-10-12 DIAGNOSIS — R278 Other lack of coordination: Secondary | ICD-10-CM | POA: Diagnosis not present

## 2016-10-12 DIAGNOSIS — M62838 Other muscle spasm: Secondary | ICD-10-CM | POA: Diagnosis not present

## 2016-10-12 DIAGNOSIS — N50812 Left testicular pain: Secondary | ICD-10-CM | POA: Diagnosis not present

## 2016-10-12 DIAGNOSIS — R102 Pelvic and perineal pain: Secondary | ICD-10-CM | POA: Diagnosis not present

## 2016-10-20 DIAGNOSIS — M62838 Other muscle spasm: Secondary | ICD-10-CM | POA: Diagnosis not present

## 2016-10-20 DIAGNOSIS — N50812 Left testicular pain: Secondary | ICD-10-CM | POA: Diagnosis not present

## 2016-10-20 DIAGNOSIS — R102 Pelvic and perineal pain: Secondary | ICD-10-CM | POA: Diagnosis not present

## 2016-10-20 DIAGNOSIS — M6281 Muscle weakness (generalized): Secondary | ICD-10-CM | POA: Diagnosis not present

## 2016-10-27 DIAGNOSIS — M6281 Muscle weakness (generalized): Secondary | ICD-10-CM | POA: Diagnosis not present

## 2016-10-27 DIAGNOSIS — N50812 Left testicular pain: Secondary | ICD-10-CM | POA: Diagnosis not present

## 2016-10-27 DIAGNOSIS — R102 Pelvic and perineal pain: Secondary | ICD-10-CM | POA: Diagnosis not present

## 2016-10-27 DIAGNOSIS — M62838 Other muscle spasm: Secondary | ICD-10-CM | POA: Diagnosis not present

## 2016-10-31 DIAGNOSIS — Z23 Encounter for immunization: Secondary | ICD-10-CM | POA: Diagnosis not present

## 2016-11-03 DIAGNOSIS — M62838 Other muscle spasm: Secondary | ICD-10-CM | POA: Diagnosis not present

## 2016-11-03 DIAGNOSIS — M6281 Muscle weakness (generalized): Secondary | ICD-10-CM | POA: Diagnosis not present

## 2016-11-03 DIAGNOSIS — N50812 Left testicular pain: Secondary | ICD-10-CM | POA: Diagnosis not present

## 2016-11-03 DIAGNOSIS — R102 Pelvic and perineal pain: Secondary | ICD-10-CM | POA: Diagnosis not present

## 2016-11-10 DIAGNOSIS — M62838 Other muscle spasm: Secondary | ICD-10-CM | POA: Diagnosis not present

## 2016-11-10 DIAGNOSIS — M6281 Muscle weakness (generalized): Secondary | ICD-10-CM | POA: Diagnosis not present

## 2016-11-10 DIAGNOSIS — N50812 Left testicular pain: Secondary | ICD-10-CM | POA: Diagnosis not present

## 2016-11-10 DIAGNOSIS — R102 Pelvic and perineal pain: Secondary | ICD-10-CM | POA: Diagnosis not present

## 2016-11-17 DIAGNOSIS — M62838 Other muscle spasm: Secondary | ICD-10-CM | POA: Diagnosis not present

## 2016-11-17 DIAGNOSIS — M6281 Muscle weakness (generalized): Secondary | ICD-10-CM | POA: Diagnosis not present

## 2016-11-17 DIAGNOSIS — R102 Pelvic and perineal pain: Secondary | ICD-10-CM | POA: Diagnosis not present

## 2016-11-17 DIAGNOSIS — N50812 Left testicular pain: Secondary | ICD-10-CM | POA: Diagnosis not present

## 2016-12-22 DIAGNOSIS — R102 Pelvic and perineal pain: Secondary | ICD-10-CM | POA: Diagnosis not present

## 2016-12-22 DIAGNOSIS — M6281 Muscle weakness (generalized): Secondary | ICD-10-CM | POA: Diagnosis not present

## 2016-12-22 DIAGNOSIS — N50812 Left testicular pain: Secondary | ICD-10-CM | POA: Diagnosis not present

## 2016-12-22 DIAGNOSIS — M62838 Other muscle spasm: Secondary | ICD-10-CM | POA: Diagnosis not present

## 2016-12-28 DIAGNOSIS — L570 Actinic keratosis: Secondary | ICD-10-CM | POA: Diagnosis not present

## 2016-12-28 DIAGNOSIS — Z23 Encounter for immunization: Secondary | ICD-10-CM | POA: Diagnosis not present

## 2016-12-28 DIAGNOSIS — L821 Other seborrheic keratosis: Secondary | ICD-10-CM | POA: Diagnosis not present

## 2017-01-04 DIAGNOSIS — Z1389 Encounter for screening for other disorder: Secondary | ICD-10-CM | POA: Diagnosis not present

## 2017-01-04 DIAGNOSIS — Z0000 Encounter for general adult medical examination without abnormal findings: Secondary | ICD-10-CM | POA: Diagnosis not present

## 2017-01-04 DIAGNOSIS — C61 Malignant neoplasm of prostate: Secondary | ICD-10-CM | POA: Diagnosis not present

## 2017-01-04 DIAGNOSIS — N50812 Left testicular pain: Secondary | ICD-10-CM | POA: Diagnosis not present

## 2017-01-04 DIAGNOSIS — R102 Pelvic and perineal pain: Secondary | ICD-10-CM | POA: Diagnosis not present

## 2017-01-04 DIAGNOSIS — Z23 Encounter for immunization: Secondary | ICD-10-CM | POA: Diagnosis not present

## 2017-01-04 DIAGNOSIS — F322 Major depressive disorder, single episode, severe without psychotic features: Secondary | ICD-10-CM | POA: Diagnosis not present

## 2017-01-04 DIAGNOSIS — M6281 Muscle weakness (generalized): Secondary | ICD-10-CM | POA: Diagnosis not present

## 2017-01-04 DIAGNOSIS — M62838 Other muscle spasm: Secondary | ICD-10-CM | POA: Diagnosis not present

## 2017-01-04 DIAGNOSIS — N400 Enlarged prostate without lower urinary tract symptoms: Secondary | ICD-10-CM | POA: Diagnosis not present

## 2017-01-04 DIAGNOSIS — Z1159 Encounter for screening for other viral diseases: Secondary | ICD-10-CM | POA: Diagnosis not present

## 2017-01-06 DIAGNOSIS — N320 Bladder-neck obstruction: Secondary | ICD-10-CM | POA: Diagnosis not present

## 2017-01-06 DIAGNOSIS — Z8546 Personal history of malignant neoplasm of prostate: Secondary | ICD-10-CM | POA: Diagnosis not present

## 2017-01-06 DIAGNOSIS — N5201 Erectile dysfunction due to arterial insufficiency: Secondary | ICD-10-CM | POA: Diagnosis not present

## 2017-01-18 DIAGNOSIS — N50812 Left testicular pain: Secondary | ICD-10-CM | POA: Diagnosis not present

## 2017-01-18 DIAGNOSIS — M6281 Muscle weakness (generalized): Secondary | ICD-10-CM | POA: Diagnosis not present

## 2017-01-18 DIAGNOSIS — R102 Pelvic and perineal pain: Secondary | ICD-10-CM | POA: Diagnosis not present

## 2017-01-18 DIAGNOSIS — M62838 Other muscle spasm: Secondary | ICD-10-CM | POA: Diagnosis not present

## 2017-02-09 DIAGNOSIS — M62838 Other muscle spasm: Secondary | ICD-10-CM | POA: Diagnosis not present

## 2017-02-09 DIAGNOSIS — R102 Pelvic and perineal pain: Secondary | ICD-10-CM | POA: Diagnosis not present

## 2017-02-09 DIAGNOSIS — M6281 Muscle weakness (generalized): Secondary | ICD-10-CM | POA: Diagnosis not present

## 2017-02-09 DIAGNOSIS — N50812 Left testicular pain: Secondary | ICD-10-CM | POA: Diagnosis not present

## 2017-04-15 DIAGNOSIS — S30861A Insect bite (nonvenomous) of abdominal wall, initial encounter: Secondary | ICD-10-CM | POA: Diagnosis not present

## 2017-04-15 DIAGNOSIS — S40861A Insect bite (nonvenomous) of right upper arm, initial encounter: Secondary | ICD-10-CM | POA: Diagnosis not present

## 2017-04-15 DIAGNOSIS — S70262A Insect bite (nonvenomous), left hip, initial encounter: Secondary | ICD-10-CM | POA: Diagnosis not present

## 2017-05-23 DIAGNOSIS — M791 Myalgia: Secondary | ICD-10-CM | POA: Diagnosis not present

## 2017-05-23 DIAGNOSIS — M545 Low back pain: Secondary | ICD-10-CM | POA: Diagnosis not present

## 2017-05-25 ENCOUNTER — Ambulatory Visit
Admission: RE | Admit: 2017-05-25 | Discharge: 2017-05-25 | Disposition: A | Discharge status: Home / Self Care | Payer: PPO | Source: Ambulatory Visit | Attending: Internal Medicine | Admitting: Internal Medicine

## 2017-05-25 ENCOUNTER — Other Ambulatory Visit: Payer: Self-pay | Admitting: Internal Medicine

## 2017-05-25 DIAGNOSIS — M25552 Pain in left hip: Secondary | ICD-10-CM | POA: Diagnosis not present

## 2017-05-25 DIAGNOSIS — M7918 Myalgia, other site: Secondary | ICD-10-CM

## 2017-06-01 ENCOUNTER — Ambulatory Visit: Payer: PPO | Admitting: Physical Therapy

## 2017-06-07 ENCOUNTER — Encounter: Payer: Self-pay | Admitting: Physical Therapy

## 2017-06-07 ENCOUNTER — Ambulatory Visit: Payer: PPO | Attending: Internal Medicine | Admitting: Physical Therapy

## 2017-06-07 DIAGNOSIS — M25652 Stiffness of left hip, not elsewhere classified: Secondary | ICD-10-CM | POA: Diagnosis not present

## 2017-06-07 DIAGNOSIS — M25552 Pain in left hip: Secondary | ICD-10-CM | POA: Insufficient documentation

## 2017-06-07 NOTE — Therapy (Signed)
[place], [place], [address] Phone: [phone]   Fax:  [phone]  Physical Therapy Evaluation  Patient Details  Name: [name] MRN: [id number] Date of Birth: February 11, 1946 Referring Provider: Wenda Low, MD  Encounter Date: 06/07/2017      PT End of Session - 06/07/17 0942    Visit Number 1   Number of Visits 13   Date for PT Re-Evaluation 07/21/17   Authorization Type [employer]- KX at visit 15   PT Start Time 0932   PT Stop Time 1012   PT Time Calculation (min) 40 min   Activity Tolerance Patient tolerated treatment well   Behavior During Therapy [facility] for tasks assessed/performed      Past Medical History:  Diagnosis Date  . Abdominal pain   . Arthritis   . Cancer ([place])    PROSTATE CANCER 3 YRS AGO  . Hernia     Past Surgical History:  Procedure Laterality Date  . HERNIA REPAIR    . INGUINAL HERNIA REPAIR  10/07/2011   Procedure: HERNIA REPAIR INGUINAL ADULT;  Surgeon: Odis Hollingshead, MD;  Location: [facility];  Service: General;  Laterality: Left;  Left Inguinal Repair with Mesh  . INSERTION PROSTATE RADIATION SEED  December 2010   seed implants   . KNEE CARTILAGE SURGERY  6/12   arthritis and cartilage removal     There were no vitals filed for this visit.       Subjective Assessment - 06/07/17 0935    Subjective In the morning lower back is in pain, goes across around tailbone. Does a couple of stretches before getting up that are helpful. Within 30 min pain is gone after moving around. Sporatic comes and goes. Severe pain in L buttock, can lay with L knee pulled into chest which always alleviates pain. Some days where there is no pain, sometimes 2-3/day. Aleve about 4 days/week. Denies symptoms into LLE. Aggrivated by bending to lift, cleaned deck.    Currently in Pain? No/denies            [facility] PT Assessment - 06/07/17 0001      Assessment   Medical  Diagnosis LBP   Referring Provider Wenda Low, MD   Prior Therapy yes     Precautions   Precautions None     Restrictions   Weight Bearing Restrictions No     Balance Screen   Has the patient fallen in the past 6 months No     [place] residence   Living Arrangements Spouse/significant other     Prior Function   Vocation Requirements consulting work- travels frequently     Cognition   Overall Cognitive Status Within Functional Limits for tasks assessed     Observation/Other Assessments   Focus on Therapeutic Outcomes (FOTO)  43% limited (goal 29%)     Posture/Postural Control   Posture Comments posterior pelvic tilt, R GHJ depression, L scapular winging, lacking L knee ext     ROM / Strength   AROM / PROM / Strength AROM     AROM   Overall AROM Comments Decr IR on R, decr ER on L   AROM Assessment Site Knee   Right/Left Knee Left   Left Knee Extension -10  R knee 0     Palpation   Palpation comment R post inom rotation            Objective measurements  completed on examination: See above findings.          [facility] Adult PT Treatment/Exercise - 06/07/17 0001      Exercises   Exercises Other Exercises;Knee/Hip   Other Exercises  discussion & quick demos of current exercises at home     Knee/Hip Exercises: Stretches   Passive Hamstring Stretch Limitations seated EOB   Piriformis Stretch Limitations figure 4 push & pull                PT Education - 06/07/17 1102    Education provided Yes   Education Details anatomy of condition, POC, HEP, exercise form/rationale, tennis ball trigger point release   Person(s) Educated Patient   Methods Explanation;Demonstration;Tactile cues;Verbal cues;Handout   Comprehension Verbalized understanding;Returned demonstration;Tactile cues required;Need further instruction;Verbal cues required          PT Short Term Goals - 06/07/17 1056      PT SHORT TERM GOAL #1    Title Knee ext improved by at least 5 deg to decrease LLD that has resulted from flexion contracture by 8/10   Baseline -10 deg at eval   Time 3   Period Weeks   Status New     PT SHORT TERM GOAL #2   Title Pt will verbalize incoorporating exercises into daily activities rather than waiting for pain to do stretches   Baseline began educating at eval   Time 3   Period Weeks   Status New           PT Long Term Goals - 06/07/17 1058      PT LONG TERM GOAL #1   Title Resolution of sharp pain in L piriformis for at least 3 days in a row by 8/31   Baseline up to 3x per day   Time 6   Period Weeks   Status New     PT LONG TERM GOAL #2   Title FOTO to 29% limitation to indicate significant improvement in functional ability   Baseline 43% limitation at eval   Time 6   Period Weeks   Status New     PT LONG TERM GOAL #3   Title Pt will demo proper form for bending/squating for objects on floor   Baseline bends at waist, overuse of back, poor form   Time 6   Period Weeks   Status New     PT LONG TERM GOAL #4   Title all hip MMT to 5/5 grossly to provide proper support to lumbopelvic region   Baseline not tested at eval due to notable pelvic rotation   Time 6   Period Weeks   Status New                Plan - 06/07/17 1050    Clinical Impression Statement Pt presents to PT with complaints of intermittent L buttock pain. Pt has had PT for LBP as well as hernia that was causing testicular pain. Concordant pain found in trigger point in piriformis. Stretches corrected and added for piriformis stretching as well as use of tennis ball for trigger point release. Notable lack of L knee ext as well as pull into posterior pelvic tilt in standing posture indicate poor biomechanical chain interactions leading to pain in piriformis. Pt will benefit from skilled PT in order to decrease piriformis pain and improve lumbopelvic and LE strength and flexbility to provide support.     History and Personal Factors relevant to plan of care: h/o LBP & groin  pain, inguinal hernia repair, h/o CA, knee surgery & pain   Clinical Presentation Evolving   Clinical Presentation due to: increasing frequency of pain   Clinical Decision Making Moderate   Rehab Potential Good   PT Frequency 2x / week   PT Duration 6 weeks   PT Treatment/Interventions ADLs/Self Care Home Management;Cryotherapy;Electrical Stimulation;Iontophoresis 4mg /ml Dexamethasone;Functional mobility training;Stair training;Gait training;Ultrasound;Traction;Moist Heat;Therapeutic activities;Therapeutic exercise;Balance training;Neuromuscular re-education;Patient/family education;Passive range of motion;Manual techniques;Dry needling;Taping   PT Next Visit Plan piriformis trigger point release PRN, glut activation, L knee ext stretching; hip MMT   PT Home Exercise Plan figure 4 piriformis stretch, seated hamstring stretch, tennis ball trigger point release   Consulted and Agree with Plan of Care Patient      Patient will benefit from skilled therapeutic intervention in order to improve the following deficits and impairments:  Decreased range of motion, Increased muscle spasms, Decreased activity tolerance, Pain, Improper body mechanics, Impaired flexibility, Hypomobility, Decreased strength, Postural dysfunction  Visit Diagnosis: Pain in left hip - Plan: PT plan of care cert/re-cert  Stiffness of left hip, not elsewhere classified - Plan: PT plan of care cert/re-cert      G-Codes - 83/66/29 1102    Functional Assessment Tool Used (Outpatient Only) FOTO 43% limited (goal 29%), clinical judgement   Functional Limitation Mobility: Walking and moving around   Mobility: Walking and Moving Around Current Status (U7654) At least 40 percent but less than 60 percent impaired, limited or restricted   Mobility: Walking and Moving Around Goal Status (Y5035) At least 20 percent but less than 40 percent impaired, limited or  restricted       Problem List There are no active problems to display for this patient.   Melanee Cordial C. Shell Blanchette PT, DPT 06/07/17 11:06 AM   [facility] [address] [place], [place], [address] Phone: [phone]   Fax:  [phone]  Name: [name] MRN: [id number] Date of Birth: 02-07-1946

## 2017-06-19 ENCOUNTER — Ambulatory Visit: Payer: PPO | Admitting: Physical Therapy

## 2017-06-19 ENCOUNTER — Encounter: Payer: Self-pay | Admitting: Physical Therapy

## 2017-06-19 DIAGNOSIS — M25552 Pain in left hip: Secondary | ICD-10-CM

## 2017-06-19 DIAGNOSIS — M25652 Stiffness of left hip, not elsewhere classified: Secondary | ICD-10-CM

## 2017-06-19 NOTE — Therapy (Signed)
[place], [place], [address] Phone: [phone]   Fax:  [phone]  Physical Therapy Treatment  Patient Details  Name: [name] MRN: [id number] Date of Birth: [birth year] Referring Provider: Wenda Low, MD  Encounter Date: 06/19/2017      PT End of Session - 06/19/17 0847    Visit Number 2   Number of Visits 13   Date for PT Re-Evaluation 07/21/17   Authorization Type [employer]- KX at visit 15   PT Start Time 0847   PT Stop Time 0927   PT Time Calculation (min) 40 min   Activity Tolerance Patient tolerated treatment well   Behavior During Therapy [facility] for tasks assessed/performed      Past Medical History:  Diagnosis Date  . Abdominal pain   . Arthritis   . Cancer ([place])    PROSTATE CANCER 3 YRS AGO  . Hernia     Past Surgical History:  Procedure Laterality Date  . HERNIA REPAIR    . INGUINAL HERNIA REPAIR  10/07/2011   Procedure: HERNIA REPAIR INGUINAL ADULT;  Surgeon: Odis Hollingshead, MD;  Location: [place];  Service: General;  Laterality: Left;  Left Inguinal Repair with Mesh  . INSERTION PROSTATE RADIATION SEED  December 2010   seed implants   . KNEE CARTILAGE SURGERY  6/12   arthritis and cartilage removal     There were no vitals filed for this visit.      Subjective Assessment - 06/19/17 0847    Subjective Just about every day at some point, it gets to me. Occasionally exercises brings on pain. Shootin pain in L knee with bird dog the other day. Exercises 2/day most of the time.    Currently in Pain? No/denies                         [facility] Adult PT Treatment/Exercise - 06/19/17 0001      Knee/Hip Exercises: Stretches   Passive Hamstring Stretch Limitations supine with green strap   Gastroc Stretch 2 reps;30 seconds   Gastroc Stretch Limitations slant board     Knee/Hip Exercises: Aerobic   Nustep 5 min L6     Knee/Hip Exercises:  Machines for Strengthening   Cybex Leg Press horiz leg press 40lb     Knee/Hip Exercises: Supine   Bridges with Clamshell 5 sets;1 set;Both  10 clams each, green     Knee/Hip Exercises: Sidelying   Clams x30 each                PT Education - 06/19/17 0934    Education provided Yes   Education Details exercise form/[place], HEP, water intake, sleeping posture   Person(s) Educated Patient   Methods Explanation;Demonstration;Tactile cues;Verbal cues;Handout   Comprehension Verbalized understanding;Returned demonstration;Verbal cues required;Tactile cues required;Need further instruction          PT Short Term Goals - 06/07/17 1056      PT SHORT TERM GOAL #1   Title Knee ext improved by at least 5 deg to decrease LLD that has resulted from flexion contracture by 8/10   Baseline -10 deg at eval   Time 3   Period Weeks   Status New     PT SHORT TERM GOAL #2   Title Pt will verbalize incoorporating exercises into daily activities rather than waiting for pain to do stretches   Baseline began educating at eval   Time 3  Period Weeks   Status New           PT Long Term Goals - 06/07/17 1058      PT LONG TERM GOAL #1   Title Resolution of sharp pain in L piriformis for at least 3 days in a row by 8/31   Baseline up to 3x per day   Time 6   Period Weeks   Status New     PT LONG TERM GOAL #2   Title FOTO to 29% limitation to indicate significant improvement in functional ability   Baseline 43% limitation at eval   Time 6   Period Weeks   Status New     PT LONG TERM GOAL #3   Title Pt will demo proper form for bending/squating for objects on floor   Baseline bends at waist, overuse of back, poor form   Time 6   Period Weeks   Status New     PT LONG TERM GOAL #4   Title all hip MMT to 5/5 grossly to provide proper support to lumbopelvic region   Baseline not tested at eval due to notable pelvic rotation   Time 6   Period Weeks   Status New                Plan - 06/19/17 0935    Clinical Impression Statement Poor strength noted grossly around hips paired with poor flexibility resulting in continued poor biomechanical chain activation. Discussed night time cramping and importance of appropraite fluid intake.    PT Treatment/Interventions ADLs/Self Care Home Management;Cryotherapy;Electrical Stimulation;Iontophoresis 4mg /ml Dexamethasone;Functional mobility training;Stair training;Gait training;Ultrasound;Traction;Moist Heat;Therapeutic activities;Therapeutic exercise;Balance training;Neuromuscular re-education;Patient/family education;Passive range of motion;Manual techniques;Dry needling;Taping   PT Next Visit Plan piriformis trigger point release PRN, glut activation, L knee ext stretching   PT Home Exercise Plan figure 4 piriformis stretch, seated hamstring stretch, tennis ball trigger point release; clams, bridge with clam-green, gastroc stretch, supine hamstring stretch;    Consulted and Agree with Plan of Care Patient      Patient will benefit from skilled therapeutic intervention in order to improve the following deficits and impairments:  Decreased range of motion, Increased muscle spasms, Decreased activity tolerance, Pain, Improper body mechanics, Impaired flexibility, Hypomobility, Decreased strength, Postural dysfunction  Visit Diagnosis: Pain in left hip  Stiffness of left hip, not elsewhere classified     Problem List There are no active problems to display for this patient.   Arietta Eisenstein C. Dajahnae Vondra PT, DPT 06/19/17 9:38 AM   [facility] [address] [place], [place], [address] Phone: [phone]   Fax:  [phone]  Name: [name] MRN: [id number] Date of Birth: Sep 01, 1946

## 2017-06-22 ENCOUNTER — Encounter: Payer: Self-pay | Admitting: Physical Therapy

## 2017-06-22 ENCOUNTER — Ambulatory Visit: Payer: PPO | Attending: Internal Medicine | Admitting: Physical Therapy

## 2017-06-22 DIAGNOSIS — M25552 Pain in left hip: Secondary | ICD-10-CM | POA: Insufficient documentation

## 2017-06-22 DIAGNOSIS — M25652 Stiffness of left hip, not elsewhere classified: Secondary | ICD-10-CM

## 2017-06-22 NOTE — Therapy (Addendum)
[place], [place], [address] Phone: [phone]   Fax:  [phone]  Physical Therapy Treatment  Patient Details  Name: [name] MRN: [id number] Date of Birth: 1946-05-11 Referring Provider: Wenda Low, MD  Encounter Date: 06/22/2017      PT End of Session - 06/22/17 0933    Visit Number 3   Number of Visits 13   Date for PT Re-Evaluation 07/21/17   Authorization Type [employer]- KX at visit 15   PT Start Time 0933   PT Stop Time 1016   PT Time Calculation (min) 43 min   Activity Tolerance Patient tolerated treatment well   Behavior During Therapy [facility] for tasks assessed/performed      Past Medical History:  Diagnosis Date  . Abdominal pain   . Arthritis   . Cancer ([place])    PROSTATE CANCER 3 YRS AGO  . Hernia     Past Surgical History:  Procedure Laterality Date  . HERNIA REPAIR    . INGUINAL HERNIA REPAIR  10/07/2011   Procedure: HERNIA REPAIR INGUINAL ADULT;  Surgeon: Odis Hollingshead, MD;  Location: [place];  Service: General;  Laterality: Left;  Left Inguinal Repair with Mesh  . INSERTION PROSTATE RADIATION SEED  December 2010   seed implants   . KNEE CARTILAGE SURGERY  6/12   arthritis and cartilage removal     There were no vitals filed for this visit.      Subjective Assessment - 06/22/17 0933    Subjective Has been doing exercises, clams are very difficult. Helps wife in [facility] use the restroom throughout the night and gives dog injection every morning. Denies severe pain in L piriformis, only mild noted. Morning LBP every morning that goes away with morning routine    Patient Stated Goals decrease pain   Currently in Pain? No/denies                         [facility] Adult PT Treatment/Exercise - 06/22/17 0001      Knee/Hip Exercises: Aerobic   Nustep 5 min L7     Knee/Hip Exercises: Standing   Functional Squat Limitations weight in  heels-engage gluts     Knee/Hip Exercises: Seated   Sit to Sand 15 reps     Knee/Hip Exercises: Prone   Hip Extension 20 reps;Both   Hip Extension Limitations with knee flexed                PT Education - 06/22/17 0946    Education provided Yes   Education Details sleeping posture, gradual decrease in pain   Person(s) Educated Patient   Methods Explanation;Demonstration;Tactile cues;Verbal cues   Comprehension Verbalized understanding;Returned demonstration;Verbal cues required;Tactile cues required;Need further instruction          PT Short Term Goals - 06/07/17 1056      PT SHORT TERM GOAL #1   Title Knee ext improved by at least 5 deg to decrease LLD that has resulted from flexion contracture by 8/10   Baseline -10 deg at eval   Time 3   Period Weeks   Status New     PT SHORT TERM GOAL #2   Title Pt will verbalize incoorporating exercises into daily activities rather than waiting for pain to do stretches   Baseline began educating at eval   Time 3   Period Weeks   Status New  PT Long Term Goals - 06/07/17 1058      PT LONG TERM GOAL #1   Title Resolution of sharp pain in L piriformis for at least 3 days in a row by 8/31   Baseline up to 3x per day   Time 6   Period Weeks   Status New     PT LONG TERM GOAL #2   Title FOTO to 29% limitation to indicate significant improvement in functional ability   Baseline 43% limitation at eval   Time 6   Period Weeks   Status New     PT LONG TERM GOAL #3   Title Pt will demo proper form for bending/squating for objects on floor   Baseline bends at waist, overuse of back, poor form   Time 6   Period Weeks   Status New     PT LONG TERM GOAL #4   Title all hip MMT to 5/5 grossly to provide proper support to lumbopelvic region   Baseline not tested at eval due to notable pelvic rotation   Time 6   Period Weeks   Status New               Plan - 06/22/17 1111    Clinical Impression  Statement Significant difficulty with standing quad stretch due to lack of flexibility. Practiced squat/lift technique to utilize gluts rather than low back. Decreased episodes of severe pain indicate improved biomechanical chain activation.    PT Treatment/Interventions ADLs/Self Care Home Management;Cryotherapy;Electrical Stimulation;Iontophoresis 4mg /ml Dexamethasone;Functional mobility training;Stair training;Gait training;Ultrasound;Traction;Moist Heat;Therapeutic activities;Therapeutic exercise;Balance training;Neuromuscular re-education;Patient/family education;Passive range of motion;Manual techniques;Dry needling;Taping   PT Next Visit Plan piriformis trigger point release PRN, glut activation, L knee ext stretching   PT Home Exercise Plan figure 4 piriformis stretch, seated hamstring stretch, tennis ball trigger point release; clams, bridge with clam-green, gastroc stretch, supine hamstring stretch; glut set, prone hip ext.    Consulted and Agree with Plan of Care Patient      Patient will benefit from skilled therapeutic intervention in order to improve the following deficits and impairments:  Decreased range of motion, Increased muscle spasms, Decreased activity tolerance, Pain, Improper body mechanics, Impaired flexibility, Hypomobility, Decreased strength, Postural dysfunction  Visit Diagnosis: Pain in left hip  Stiffness of left hip, not elsewhere classified     Problem List There are no active problems to display for this patient.   Sennie Borden C. Layne Dilauro PT, DPT 06/22/17 11:16 AM   [facility] [address] [place], [place], [address] Phone: [phone]   Fax:  [phone]  Name: LEBRON NAUERT MRN: [id number] Date of Birth: 1946/08/09

## 2017-06-26 ENCOUNTER — Ambulatory Visit: Payer: PPO | Admitting: Physical Therapy

## 2017-06-26 ENCOUNTER — Encounter (INDEPENDENT_AMBULATORY_CARE_PROVIDER_SITE_OTHER): Payer: Self-pay

## 2017-06-26 ENCOUNTER — Encounter: Payer: Self-pay | Admitting: Physical Therapy

## 2017-06-26 DIAGNOSIS — M25552 Pain in left hip: Secondary | ICD-10-CM | POA: Diagnosis not present

## 2017-06-26 DIAGNOSIS — M25652 Stiffness of left hip, not elsewhere classified: Secondary | ICD-10-CM

## 2017-06-26 NOTE — Therapy (Signed)
[place], [place], [address] Phone: [phone]   Fax:  [phone]  Physical Therapy Treatment  Patient Details  Name: [name] MRN: [id number] Date of Birth: 05-28-1946 Referring Provider: Wenda Low, MD  Encounter Date: 06/26/2017      PT End of Session - 06/26/17 0846    Visit Number 4   Number of Visits 13   Date for PT Re-Evaluation 07/21/17   Authorization Type [employer]- KX at visit 15   PT Start Time 0847   PT Stop Time 0930   PT Time Calculation (min) 43 min   Activity Tolerance Patient tolerated treatment well   Behavior During Therapy [facility] for tasks assessed/performed      Past Medical History:  Diagnosis Date  . Abdominal pain   . Arthritis   . Cancer ([place])    PROSTATE CANCER 3 YRS AGO  . Hernia     Past Surgical History:  Procedure Laterality Date  . HERNIA REPAIR    . INGUINAL HERNIA REPAIR  10/07/2011   Procedure: HERNIA REPAIR INGUINAL ADULT;  Surgeon: Odis Hollingshead, MD;  Location: [place];  Service: General;  Laterality: Left;  Left Inguinal Repair with Mesh  . INSERTION PROSTATE RADIATION SEED  December 2010   seed implants   . KNEE CARTILAGE SURGERY  6/12   arthritis and cartilage removal     There were no vitals filed for this visit.      Subjective Assessment - 06/26/17 0846    Subjective L knee has been a little tender. Still quite painful in the AM until he gets going but thinks sleeping posture is helping, less pain notable through the night.    Currently in Pain? No/denies                         [facility] Adult PT Treatment/Exercise - 06/26/17 0001      Exercises   Other Exercises  getting out of bed- decompression, LTR, knee/knees to chest     Knee/Hip Exercises: Stretches   Sports administrator Limitations prone with stretch strap   Gastroc Stretch 2 reps;30 seconds   Gastroc Stretch Limitations slant board     Knee/Hip Exercises: Aerobic   Elliptical L1 ramp 10 5 min     Knee/Hip Exercises: Standing   Extension Limitations bird dog, elbows on table   Other Standing Knee Exercises hip hike on step     Knee/Hip Exercises: Supine   Single Leg Bridge 10 reps  opp leg with full ext/quad set                PT Education - 06/26/17 0926    Education provided Yes   Education Details exercise form/rationale, night time stiffness/AM pain   Person(s) Educated Patient   Methods Explanation;Demonstration;Tactile cues;Verbal cues;Handout   Comprehension Verbalized understanding;Returned demonstration;Verbal cues required;Tactile cues required;Need further instruction          PT Short Term Goals - 06/07/17 1056      PT SHORT TERM GOAL #1   Title Knee ext improved by at least 5 deg to decrease LLD that has resulted from flexion contracture by 8/10   Baseline -10 deg at eval   Time 3   Period Weeks   Status New     PT SHORT TERM GOAL #2   Title Pt will verbalize incoorporating exercises into daily activities rather than waiting for pain to do stretches  Baseline began educating at eval   Time 3   Period Weeks   Status New           PT Long Term Goals - 06/07/17 1058      PT LONG TERM GOAL #1   Title Resolution of sharp pain in L piriformis for at least 3 days in a row by 8/31   Baseline up to 3x per day   Time 6   Period Weeks   Status New     PT LONG TERM GOAL #2   Title FOTO to 29% limitation to indicate significant improvement in functional ability   Baseline 43% limitation at eval   Time 6   Period Weeks   Status New     PT LONG TERM GOAL #3   Title Pt will demo proper form for bending/squating for objects on floor   Baseline bends at waist, overuse of back, poor form   Time 6   Period Weeks   Status New     PT LONG TERM GOAL #4   Title all hip MMT to 5/5 grossly to provide proper support to lumbopelvic region   Baseline not tested at eval due to notable  pelvic rotation   Time 6   Period Weeks   Status New               Plan - 06/26/17 0930    Clinical Impression Statement Pt with difficulty doing standing quad stretch, provided option for prone stretch which was less irritable to knee. added decompression exercises to AM routine. Pt tolerated exerciess well today with significant difficulty but good form. Noted clicking in R hip with each hike of R hip.    PT Treatment/Interventions ADLs/Self Care Home Management;Cryotherapy;Electrical Stimulation;Iontophoresis 4mg /ml Dexamethasone;Functional mobility training;Stair training;Gait training;Ultrasound;Traction;Moist Heat;Therapeutic activities;Therapeutic exercise;Balance training;Neuromuscular re-education;Patient/family education;Passive range of motion;Manual techniques;Dry needling;Taping   PT Next Visit Plan add single leg bridge to HEP, physioball exercises   PT Home Exercise Plan figure 4 piriformis stretch, seated hamstring stretch, tennis ball trigger point release; clams, bridge with clam-green, gastroc stretch, supine hamstring stretch; glut set, prone hip ext. decompression, prone quad stretch, hip hike on step;    Consulted and Agree with Plan of Care Patient      Patient will benefit from skilled therapeutic intervention in order to improve the following deficits and impairments:  Decreased range of motion, Increased muscle spasms, Decreased activity tolerance, Pain, Improper body mechanics, Impaired flexibility, Hypomobility, Decreased strength, Postural dysfunction  Visit Diagnosis: Pain in left hip  Stiffness of left hip, not elsewhere classified     Problem List There are no active problems to display for this patient.  [name] C. Kaleeya Hancock PT, DPT 06/26/17 9:33 AM   [facility] [address] [place], [place], [address] Phone: [phone]   Fax:  [phone]  Name: [name] MRN: [id number] Date of  Birth: December 15, 1945

## 2017-06-26 NOTE — Patient Instructions (Addendum)
    Decompression handout

## 2017-06-28 ENCOUNTER — Encounter: Payer: Self-pay | Admitting: Physical Therapy

## 2017-06-28 ENCOUNTER — Ambulatory Visit: Payer: PPO | Admitting: Physical Therapy

## 2017-06-28 DIAGNOSIS — M25552 Pain in left hip: Secondary | ICD-10-CM | POA: Diagnosis not present

## 2017-06-28 DIAGNOSIS — M25652 Stiffness of left hip, not elsewhere classified: Secondary | ICD-10-CM

## 2017-06-28 NOTE — Therapy (Signed)
[place], [place], [address] Phone: [phone]   Fax:  [phone]  Physical Therapy Treatment  Patient Details  Name: [name] MRN: [id number] Date of Birth: 1946/02/01 Referring Provider: Wenda Low, MD  Encounter Date: 06/28/2017      PT End of Session - 06/28/17 0931    Visit Number 5   Number of Visits 13   Date for PT Re-Evaluation 07/21/17   Authorization Type [employer]- KX at visit 15   PT Start Time 0931   PT Stop Time 1015   PT Time Calculation (min) 44 min   Activity Tolerance Patient tolerated treatment well   Behavior During Therapy [facility] for tasks assessed/performed      Past Medical History:  Diagnosis Date  . Abdominal pain   . Arthritis   . Cancer ([place])    PROSTATE CANCER 3 YRS AGO  . Hernia     Past Surgical History:  Procedure Laterality Date  . HERNIA REPAIR    . INGUINAL HERNIA REPAIR  10/07/2011   Procedure: HERNIA REPAIR INGUINAL ADULT;  Surgeon: Odis Hollingshead, MD;  Location: [place];  Service: General;  Laterality: Left;  Left Inguinal Repair with Mesh  . INSERTION PROSTATE RADIATION SEED  December 2010   seed implants   . KNEE CARTILAGE SURGERY  6/12   arthritis and cartilage removal     There were no vitals filed for this visit.      Subjective Assessment - 06/28/17 0931    Subjective Stretches helped yesterday morning but had pain this morning. Pain in piriformis has significantly decreased.                          [place] Adult PT Treatment/Exercise - 06/28/17 0001      Exercises   Other Exercises  reformer- see PT note     Knee/Hip Exercises: Stretches   Passive Hamstring Stretch Limitations seated EOB with strap   Quad Stretch Limitations prone with stretch strap   Gastroc Stretch 2 reps;30 seconds   Gastroc Stretch Limitations slant board     Knee/Hip Exercises: Aerobic   Elliptical L1 ramp 10 5 min       Reformer: Bilateral press 2R1B, neutral bar on arch, turnout on heels, neutral on toes Single leg press 2R1B Static bridge 2R1B          PT Education - 06/28/17 0946    Education provided Yes   Education Details exercise form/rationale, progress toward STGs, mattress, working exercises into daily activities. trying a different mattress   Person(s) Educated Patient   Methods Explanation;Demonstration;Tactile cues;Verbal cues   Comprehension Verbalized understanding;Returned demonstration;Verbal cues required;Tactile cues required;Need further instruction          PT Short Term Goals - 06/28/17 0936      PT SHORT TERM GOAL #1   Title Knee ext improved by at least 5 deg to decrease LLD that has resulted from flexion contracture by 8/10   Baseline -6   Status On-going     PT SHORT TERM GOAL #2   Title Pt will verbalize incoorporating exercises into daily activities rather than waiting for pain to do stretches   Baseline is trying to create a better timing in his day   Status Achieved           PT Long Term Goals - 06/07/17 1058      PT LONG TERM GOAL #  1   Title Resolution of sharp pain in L piriformis for at least 3 days in a row by 8/31   Baseline up to 3x per day   Time 6   Period Weeks   Status New     PT LONG TERM GOAL #2   Title FOTO to 29% limitation to indicate significant improvement in functional ability   Baseline 43% limitation at eval   Time 6   Period Weeks   Status New     PT LONG TERM GOAL #3   Title Pt will demo proper form for bending/squating for objects on floor   Baseline bends at waist, overuse of back, poor form   Time 6   Period Weeks   Status New     PT LONG TERM GOAL #4   Title all hip MMT to 5/5 grossly to provide proper support to lumbopelvic region   Baseline not tested at eval due to notable pelvic rotation   Time 6   Period Weeks   Status New               Plan - 06/28/17 1016    Clinical Impression  Statement Biomedical scientist for strength challenges today which pt verbalized difficulty with but enjoyed. Will continue to challenge strength for support to lumbopelvic region.    PT Treatment/Interventions ADLs/Self Care Home Management;Cryotherapy;Electrical Stimulation;Iontophoresis 4mg /ml Dexamethasone;Functional mobility training;Stair training;Gait training;Ultrasound;Traction;Moist Heat;Therapeutic activities;Therapeutic exercise;Balance training;Neuromuscular re-education;Patient/family education;Passive range of motion;Manual techniques;Dry needling;Taping   PT Next Visit Plan reformer   PT Home Exercise Plan figure 4 piriformis stretch, seated hamstring stretch, tennis ball trigger point release; clams, bridge with clam-green, gastroc stretch, supine hamstring stretch; glut set, prone hip ext. decompression, prone quad stretch, hip hike on step;    Consulted and Agree with Plan of Care Patient      Patient will benefit from skilled therapeutic intervention in order to improve the following deficits and impairments:  Decreased range of motion, Increased muscle spasms, Decreased activity tolerance, Pain, Improper body mechanics, Impaired flexibility, Hypomobility, Decreased strength, Postural dysfunction  Visit Diagnosis: Pain in left hip  Stiffness of left hip, not elsewhere classified     Problem List There are no active problems to display for this patient.   Velna Hedgecock C. Sole Lengacher PT, DPT 06/28/17 1:08 PM   [facility] [address] [place], [place], [address] Phone: [phone]   Fax:  [phone]  Name: [name] MRN: [id number] Date of Birth: [birth year]

## 2017-07-03 ENCOUNTER — Ambulatory Visit: Payer: PPO | Admitting: Physical Therapy

## 2017-07-03 ENCOUNTER — Encounter: Payer: Self-pay | Admitting: Physical Therapy

## 2017-07-03 DIAGNOSIS — M25652 Stiffness of left hip, not elsewhere classified: Secondary | ICD-10-CM

## 2017-07-03 DIAGNOSIS — M25552 Pain in left hip: Secondary | ICD-10-CM | POA: Diagnosis not present

## 2017-07-03 NOTE — Therapy (Signed)
[place], [place], [address] Phone: [phone]   Fax:  [phone]  Physical Therapy Treatment  Patient Details  Name: [name] MRN: [id number] Date of Birth: 11-04-1946 Referring Provider: Wenda Low, MD  Encounter Date: 07/03/2017      PT End of Session - 07/03/17 0847    Visit Number 6   Number of Visits 13   Date for PT Re-Evaluation 07/21/17   Authorization Type [employer]- KX at visit 15   PT Start Time 0846   PT Stop Time 0925   PT Time Calculation (min) 39 min   Activity Tolerance Patient tolerated treatment well   Behavior During Therapy [facility] for tasks assessed/performed      Past Medical History:  Diagnosis Date  . Abdominal pain   . Arthritis   . Cancer ([place])    PROSTATE CANCER 3 YRS AGO  . Hernia     Past Surgical History:  Procedure Laterality Date  . HERNIA REPAIR    . INGUINAL HERNIA REPAIR  10/07/2011   Procedure: HERNIA REPAIR INGUINAL ADULT;  Surgeon: Odis Hollingshead, MD;  Location: [place];  Service: General;  Laterality: Left;  Left Inguinal Repair with Mesh  . INSERTION PROSTATE RADIATION SEED  December 2010   seed implants   . KNEE CARTILAGE SURGERY  6/12   arthritis and cartilage removal     There were no vitals filed for this visit.      Subjective Assessment - 07/03/17 0847    Subjective More "attacks" in the hip over the weekend- Sat & Sun. Slowing down in the morning to do stretches which has helpful. Able to relieve spasm with figure 4 piriformis stretch. Spasms just as severe as they used to be.    Patient Stated Goals decrease pain   Currently in Pain? No/denies                         [facility] Adult PT Treatment/Exercise - 07/03/17 0001      Exercises   Other Exercises  reformer- see PT note     Knee/Hip Exercises: Aerobic   Elliptical L1 ramp 10 5 min       Pilates Reformer used for LE/core strength,  postural strength, lumbopelvic disassociation and core control.  Exercises included: Footwork- 2R 1B, neutral, turnout Bridging- 2R1B static, with press-ball bw knees  Feet in Straps- 2R1B turnout press, circles Sidelying press 1R1B  Figure 4 piriformis stretch             PT Short Term Goals - 06/28/17 0936      PT SHORT TERM GOAL #1   Title Knee ext improved by at least 5 deg to decrease LLD that has resulted from flexion contracture by 8/10   Baseline -6   Status On-going     PT SHORT TERM GOAL #2   Title Pt will verbalize incoorporating exercises into daily activities rather than waiting for pain to do stretches   Baseline is trying to create a better timing in his day   Status Achieved           PT Long Term Goals - 06/07/17 1058      PT LONG TERM GOAL #1   Title Resolution of sharp pain in L piriformis for at least 3 days in a row by 8/31   Baseline up to 3x per day   Time 6   Period Weeks  Status New     PT LONG TERM GOAL #2   Title FOTO to 29% limitation to indicate significant improvement in functional ability   Baseline 43% limitation at eval   Time 6   Period Weeks   Status New     PT LONG TERM GOAL #3   Title Pt will demo proper form for bending/squating for objects on floor   Baseline bends at waist, overuse of back, poor form   Time 6   Period Weeks   Status New     PT LONG TERM GOAL #4   Title all hip MMT to 5/5 grossly to provide proper support to lumbopelvic region   Baseline not tested at eval due to notable pelvic rotation   Time 6   Period Weeks   Status New               Plan - 07/03/17 6720    Clinical Impression Statement Fatigue noted in L leg earlier than R, tendency for L foot turnout indicating a lack of muscular control. Spasms of piriformis reportedly later in day indicating lack of endurance for daily activities.    PT Treatment/Interventions ADLs/Self Care Home Management;Cryotherapy;Electrical  Stimulation;Iontophoresis 4mg /ml Dexamethasone;Functional mobility training;Stair training;Gait training;Ultrasound;Traction;Moist Heat;Therapeutic activities;Therapeutic exercise;Balance training;Neuromuscular re-education;Patient/family education;Passive range of motion;Manual techniques;Dry needling;Taping   PT Next Visit Plan reformer-lunge, tower   PT Home Exercise Plan figure 4 piriformis stretch, seated hamstring stretch, tennis ball trigger point release; clams, bridge with clam-green, gastroc stretch, supine hamstring stretch; glut set, prone hip ext. decompression, prone quad stretch, hip hike on step;    Consulted and Agree with Plan of Care Patient      Patient will benefit from skilled therapeutic intervention in order to improve the following deficits and impairments:  Decreased range of motion, Increased muscle spasms, Decreased activity tolerance, Pain, Improper body mechanics, Impaired flexibility, Hypomobility, Decreased strength, Postural dysfunction  Visit Diagnosis: Pain in left hip  Stiffness of left hip, not elsewhere classified     Problem List There are no active problems to display for this patient.   Eyanna Mcgonagle C. Lakenya Riendeau PT, DPT 07/03/17 10:24 AM   [facility] [address] [place], [place], [address] Phone: [phone]   Fax:  [phone]  Name: [name] MRN: [id number] Date of Birth: 01-May-1946

## 2017-07-05 ENCOUNTER — Encounter: Payer: PPO | Admitting: Physical Therapy

## 2017-07-06 ENCOUNTER — Ambulatory Visit: Payer: PPO | Admitting: Physical Therapy

## 2017-07-06 DIAGNOSIS — M25652 Stiffness of left hip, not elsewhere classified: Secondary | ICD-10-CM

## 2017-07-06 DIAGNOSIS — M25552 Pain in left hip: Secondary | ICD-10-CM

## 2017-07-06 NOTE — Therapy (Signed)
[place], [place], [address] Phone: [phone]   Fax:  [phone]  Physical Therapy Treatment  Patient Details  Name: [name] MRN: [id number] Date of Birth: 01-09-1946 Referring Provider: Wenda Low, MD  Encounter Date: 07/06/2017      PT End of Session - 07/06/17 0941    Visit Number 7   Number of Visits 13   Date for PT Re-Evaluation 07/21/17   Authorization Type [employer]- KX at visit 15   PT Start Time 0930   PT Stop Time 1015   PT Time Calculation (min) 45 min      Past Medical History:  Diagnosis Date  . Abdominal pain   . Arthritis   . Cancer ([place])    PROSTATE CANCER 3 YRS AGO  . Hernia     Past Surgical History:  Procedure Laterality Date  . HERNIA REPAIR    . INGUINAL HERNIA REPAIR  10/07/2011   Procedure: HERNIA REPAIR INGUINAL ADULT;  Surgeon: Odis Hollingshead, MD;  Location: [place];  Service: General;  Laterality: Left;  Left Inguinal Repair with Mesh  . INSERTION PROSTATE RADIATION SEED  December 2010   seed implants   . KNEE CARTILAGE SURGERY  6/12   arthritis and cartilage removal     There were no vitals filed for this visit.      Subjective Assessment - 07/06/17 0936    Subjective No attacks over the last couple of days. Always some pain in the morning, relieved by stretches.                          [place] Adult PT Treatment/Exercise - 07/06/17 0001      Exercises   Other Exercises  reformer- see PT note     Knee/Hip Exercises: Stretches   Active Hamstring Stretch 3 reps;30 seconds   Active Hamstring Stretch Limitations strap    Gastroc Stretch 2 reps;30 seconds   Gastroc Stretch Limitations slant board     Knee/Hip Exercises: Aerobic   Elliptical L4 ramp 10 5 min     Knee/Hip Exercises: Supine   Bridges with Clamshell 5 sets;1 set;Both  10 clams each, green   Single Leg Bridge 10 reps  opp leg with full  ext/quad set     Knee/Hip Exercises: Sidelying   Clams x30 each  green       Pilates Reformer used for LE/core strength, postural strength, lumbopelvic disassociation and core control.  Exercises included: Footwork- 2R 1B, neutral, turnout Bridging- 2R1B static, with press-ball bw knees, added clam x 10  Feet in Straps- 2R1B turnout press, circles, arcs, neutral and ER  Sidelying press 1R1B              PT Short Term Goals - 06/28/17 0936      PT SHORT TERM GOAL #1   Title Knee ext improved by at least 5 deg to decrease LLD that has resulted from flexion contracture by 8/10   Baseline -6   Status On-going     PT SHORT TERM GOAL #2   Title Pt will verbalize incoorporating exercises into daily activities rather than waiting for pain to do stretches   Baseline is trying to create a better timing in his day   Status Achieved           PT Long Term Goals - 06/07/17 1058      PT LONG TERM GOAL #  1   Title Resolution of sharp pain in L piriformis for at least 3 days in a row by 8/31   Baseline up to 3x per day   Time 6   Period Weeks   Status New     PT LONG TERM GOAL #2   Title FOTO to 29% limitation to indicate significant improvement in functional ability   Baseline 43% limitation at eval   Time 6   Period Weeks   Status New     PT LONG TERM GOAL #3   Title Pt will demo proper form for bending/squating for objects on floor   Baseline bends at waist, overuse of back, poor form   Time 6   Period Weeks   Status New     PT LONG TERM GOAL #4   Title all hip MMT to 5/5 grossly to provide proper support to lumbopelvic region   Baseline not tested at eval due to notable pelvic rotation   Time 6   Period Weeks   Status New               Plan - 07/06/17 1010    Clinical Impression Statement Improved motor control on reformer today. Pt notes less episodes of pain "attacks" over the last 2 days. Progressing toward goals.    PT Next Visit Plan  reformer-lunge, tower   PT Home Exercise Plan figure 4 piriformis stretch, seated hamstring stretch, tennis ball trigger point release; clams, bridge with clam-green, gastroc stretch, supine hamstring stretch; glut set, prone hip ext. decompression, prone quad stretch, hip hike on step;       Patient will benefit from skilled therapeutic intervention in order to improve the following deficits and impairments:  Decreased range of motion, Increased muscle spasms, Decreased activity tolerance, Pain, Improper body mechanics, Impaired flexibility, Hypomobility, Decreased strength, Postural dysfunction  Visit Diagnosis: Pain in left hip  Stiffness of left hip, not elsewhere classified     Problem List There are no active problems to display for this patient.   Dorene Ar, [place] 07/06/2017, 10:12 AM  [facility] [address] [place], [place], [address] Phone: [phone]   Fax:  [phone]  Name: [name] MRN: [id number] Date of Birth: Aug 27, 1946

## 2017-07-10 ENCOUNTER — Encounter: Payer: Self-pay | Admitting: Physical Therapy

## 2017-07-10 ENCOUNTER — Encounter: Payer: PPO | Admitting: Physical Therapy

## 2017-07-10 ENCOUNTER — Ambulatory Visit: Payer: PPO | Admitting: Physical Therapy

## 2017-07-10 DIAGNOSIS — M25552 Pain in left hip: Secondary | ICD-10-CM | POA: Diagnosis not present

## 2017-07-10 DIAGNOSIS — M25652 Stiffness of left hip, not elsewhere classified: Secondary | ICD-10-CM

## 2017-07-10 NOTE — Therapy (Signed)
[place], [place], [address] Phone: [phone]   Fax:  [phone]  Physical Therapy Treatment  Patient Details  Name: [name] MRN: [id number] Date of Birth: September 19, 1946 Referring Provider: Wenda Low, MD  Encounter Date: 07/10/2017      PT End of Session - 07/10/17 0929    Visit Number 8   Number of Visits 13   Date for PT Re-Evaluation 07/21/17   Authorization Type [employer]- KX at visit 15   PT Start Time 0930   PT Stop Time 1013   PT Time Calculation (min) 43 min   Activity Tolerance Patient tolerated treatment well   Behavior During Therapy [facility] for tasks assessed/performed      Past Medical History:  Diagnosis Date  . Abdominal pain   . Arthritis   . Cancer ([place])    PROSTATE CANCER 3 YRS AGO  . Hernia     Past Surgical History:  Procedure Laterality Date  . HERNIA REPAIR    . INGUINAL HERNIA REPAIR  10/07/2011   Procedure: HERNIA REPAIR INGUINAL ADULT;  Surgeon: Odis Hollingshead, MD;  Location: [place];  Service: General;  Laterality: Left;  Left Inguinal Repair with Mesh  . INSERTION PROSTATE RADIATION SEED  December 2010   seed implants   . KNEE CARTILAGE SURGERY  6/12   arthritis and cartilage removal     There were no vitals filed for this visit.      Subjective Assessment - 07/10/17 0934    Subjective Used trimmer on long extension over the weekend which aggrivated his pain. Was able to relieve with stretches and laying down with leg in the air. AM stretches have helped considerably.    Patient Stated Goals decrease pain                         [facility] Adult PT Treatment/Exercise - 07/10/17 0001      Knee/Hip Exercises: Aerobic   Elliptical L4 ramp 10 5 min       Pilates Reformer used for LE/core strength, postural strength, lumbopelvic disassociation and core control.  Exercises included: Lunge 1 red Hip flexor stretch in  lunge  Pilates Tower for LE/Core strength, postural strength, lumbopelvic disassociation and core control.  Exercises included: Supine Leg [place]- Y frog press Arm [place]- Y, bilat press with single leg raise; with bilat leg lower Seated arms Y, sit back to tower chops Supine thomas test stretch               PT Short Term Goals - 06/28/17 0936      PT SHORT TERM GOAL #1   Title Knee ext improved by at least 5 deg to decrease LLD that has resulted from flexion contracture by 8/10   Baseline -6   Status On-going     PT SHORT TERM GOAL #2   Title Pt will verbalize incoorporating exercises into daily activities rather than waiting for pain to do stretches   Baseline is trying to create a better timing in his day   Status Achieved           PT Long Term Goals - 06/07/17 1058      PT LONG TERM GOAL #1   Title Resolution of sharp pain in L piriformis for at least 3 days in a row by 8/31   Baseline up to 3x per day   Time 6   Period Weeks  Status New     PT LONG TERM GOAL #2   Title FOTO to 29% limitation to indicate significant improvement in functional ability   Baseline 43% limitation at eval   Time 6   Period Weeks   Status New     PT LONG TERM GOAL #3   Title Pt will demo proper form for bending/squating for objects on floor   Baseline bends at waist, overuse of back, poor form   Time 6   Period Weeks   Status New     PT LONG TERM GOAL #4   Title all hip MMT to 5/5 grossly to provide proper support to lumbopelvic region   Baseline not tested at eval due to notable pelvic rotation   Time 6   Period Weeks   Status New               Plan - 2017-07-21 1008    Clinical Impression Statement Increased strength and endurance challenges today with tower,had pt perform reps to fatigue. Obliques activated today, seated position was reportedly irritable to piriformis. Pt denied ice and i requested that he stretch again multiple times through the day. Pt  reports improved ability to use stretches through his day. When asked about FOTO :"I probably put the same answers as the first time because it does occasionally still bother me"   PT Treatment/Interventions ADLs/Self Care Home Management;Cryotherapy;Electrical Stimulation;Iontophoresis 4mg /ml Dexamethasone;Functional mobility training;Stair training;Gait training;Ultrasound;Traction;Moist Heat;Therapeutic activities;Therapeutic exercise;Balance training;Neuromuscular re-education;Patient/family education;Passive range of motion;Manual techniques;Dry needling;Taping   PT Next Visit Plan chops on table, transition reformer/tower exercises to table for HEP; how did piriformis feel?   PT Home Exercise Plan figure 4 piriformis stretch, seated hamstring stretch, tennis ball trigger point release; clams, bridge with clam-green, gastroc stretch, supine hamstring stretch; glut set, prone hip ext. decompression, prone quad stretch, hip hike on step;    Consulted and Agree with Plan of Care Patient      Patient will benefit from skilled therapeutic intervention in order to improve the following deficits and impairments:  Decreased range of motion, Increased muscle spasms, Decreased activity tolerance, Pain, Improper body mechanics, Impaired flexibility, Hypomobility, Decreased strength, Postural dysfunction  Visit Diagnosis: Pain in left hip  Stiffness of left hip, not elsewhere classified       G-Codes - 07/21/2017 1100    Functional Assessment Tool Used (Outpatient Only) FOTO 37% limited, clinical judgement   Functional Limitation Mobility: Walking and moving around   Mobility: Walking and Moving Around Current Status (R9163) At least 40 percent but less than 60 percent impaired, limited or restricted   Mobility: Walking and Moving Around Goal Status [phone]) At least 20 percent but less than 40 percent impaired, limited or restricted      Problem List There are no active problems to display for this  patient.   Dajahnae Vondra C. Holy [place]field PT, DPT 21-Jul-2017 11:02 AM   [facility] [address] [place], [place], [address] Phone: [phone]   Fax:  [phone]  Name: [name] MRN: [id number] Date of Birth: [birth year]

## 2017-07-12 ENCOUNTER — Ambulatory Visit: Payer: PPO | Admitting: Physical Therapy

## 2017-07-12 DIAGNOSIS — M25552 Pain in left hip: Secondary | ICD-10-CM | POA: Diagnosis not present

## 2017-07-12 DIAGNOSIS — M25652 Stiffness of left hip, not elsewhere classified: Secondary | ICD-10-CM

## 2017-07-12 NOTE — Therapy (Signed)
[place], [place], [address] Phone: [phone]   Fax:  [phone]  Physical Therapy Treatment  Patient Details  Name: [name] MRN: [id number] Date of Birth: 1946-06-01 Referring Provider: Wenda Low, MD  Encounter Date: 07/12/2017      PT End of Session - 07/12/17 0931    Visit Number 9   Number of Visits 13   Date for PT Re-Evaluation 07/21/17   Authorization Type [employer]- KX at visit 15   PT Start Time 0928   PT Stop Time 1014   PT Time Calculation (min) 46 min      Past Medical History:  Diagnosis Date  . Abdominal pain   . Arthritis   . Cancer ([place])    PROSTATE CANCER 3 YRS AGO  . Hernia     Past Surgical History:  Procedure Laterality Date  . HERNIA REPAIR    . INGUINAL HERNIA REPAIR  10/07/2011   Procedure: HERNIA REPAIR INGUINAL ADULT;  Surgeon: Odis Hollingshead, MD;  Location: [place];  Service: General;  Laterality: Left;  Left Inguinal Repair with Mesh  . INSERTION PROSTATE RADIATION SEED  December 2010   seed implants   . KNEE CARTILAGE SURGERY  6/12   arthritis and cartilage removal     There were no vitals filed for this visit.      Subjective Assessment - 07/12/17 0930    Subjective Last session flared up my pain. I was a little slower getting going in the morning.             [facility] PT Assessment - 07/12/17 0001      Observation/Other Assessments   Focus on Therapeutic Outcomes (FOTO)  37% limited (taken 07/10/2017)                      [facility] Adult PT Treatment/Exercise - 07/12/17 0001      Knee/Hip Exercises: Stretches   Active Hamstring Stretch 3 reps;30 seconds   Active Hamstring Stretch Limitations contract relax , 3 bouts    Piriformis Stretch Limitations figure 4 stretch 2 x 30 sec    Gastroc Stretch 2 reps;30 seconds   Gastroc Stretch Limitations slant board     Knee/Hip Exercises: Aerobic   Elliptical L4  ramp 10 5 min     Knee/Hip Exercises: Supine   Quad Sets Right;20 reps   Terminal Knee Extension 2 sets;10 reps;Theraband;Left   Bridges with Clamshell 5 sets;1 set;Both  5 clams each, green   Single Leg Bridge 10 reps  opp leg with full ext/quad set     Knee/Hip Exercises: Sidelying   Clams x30 each  green     Manual Therapy   Manual Therapy Soft tissue mobilization   Soft tissue mobilization tennis ball to bilateral glutes and piriformis, PROM IR/ER hip stretching                   PT Short Term Goals - 06/28/17 0936      PT SHORT TERM GOAL #1   Title Knee ext improved by at least 5 deg to decrease LLD that has resulted from flexion contracture by 8/10   Baseline -6   Status On-going     PT SHORT TERM GOAL #2   Title Pt will verbalize incoorporating exercises into daily activities rather than waiting for pain to do stretches   Baseline is trying to create a better timing in his day  Status Achieved           PT Long Term Goals - 06/07/17 1058      PT LONG TERM GOAL #1   Title Resolution of sharp pain in L piriformis for at least 3 days in a row by 8/31   Baseline up to 3x per day   Time 6   Period Weeks   Status New     PT LONG TERM GOAL #2   Title FOTO to 29% limitation to indicate significant improvement in functional ability   Baseline 43% limitation at eval   Time 6   Period Weeks   Status New     PT LONG TERM GOAL #3   Title Pt will demo proper form for bending/squating for objects on floor   Baseline bends at waist, overuse of back, poor form   Time 6   Period Weeks   Status New     PT LONG TERM GOAL #4   Title all hip MMT to 5/5 grossly to provide proper support to lumbopelvic region   Baseline not tested at eval due to notable pelvic rotation   Time 6   Period Weeks   Status New               Plan - 07/12/17 0950    Clinical Impression Statement Pt reports he almost canceled his appointment today because he is not  feeling well, having more pain in his upper glutes. Last couple of mornings have been slow to get moving despite stretching. Session spent with manual soft tissue work and PROM. Worked on knee extension with a visual improvement in knee extension at end of treatment. Pt reports feeling better at end of treatment.    PT Next Visit Plan chops on table, transition reformer/tower exercises to table for HEP; how did piriformis feel?   PT Home Exercise Plan figure 4 piriformis stretch, seated hamstring stretch, tennis ball trigger point release; clams, bridge with clam-green, gastroc stretch, supine hamstring stretch; glut set, prone hip ext. decompression, prone quad stretch, hip hike on step;    Consulted and Agree with Plan of Care Patient      Patient will benefit from skilled therapeutic intervention in order to improve the following deficits and impairments:  Decreased range of motion, Increased muscle spasms, Decreased activity tolerance, Pain, Improper body mechanics, Impaired flexibility, Hypomobility, Decreased strength, Postural dysfunction  Visit Diagnosis: Pain in left hip  Stiffness of left hip, not elsewhere classified     Problem List There are no active problems to display for this patient.   Hessie Diener [place], [place] 07/12/2017, 10:29 AM  [place], [place], [address] Phone: [phone]   Fax:  [phone]  Name: [name] MRN: [id number] Date of Birth: 1945-12-07

## 2017-07-19 ENCOUNTER — Ambulatory Visit: Payer: PPO | Admitting: Physical Therapy

## 2017-07-21 ENCOUNTER — Ambulatory Visit: Payer: PPO | Admitting: Physical Therapy

## 2017-07-21 DIAGNOSIS — M25552 Pain in left hip: Secondary | ICD-10-CM | POA: Diagnosis not present

## 2017-07-21 DIAGNOSIS — M25652 Stiffness of left hip, not elsewhere classified: Secondary | ICD-10-CM

## 2017-07-21 NOTE — Therapy (Signed)
[place], [place], [address] Phone: [phone]   Fax:  [phone]  Physical Therapy Treatment  Patient Details  Name: [name] MRN: [id number] Date of Birth: [birth year] Referring Provider: Wenda Low, MD  Encounter Date: 07/21/2017      PT End of Session - 07/21/17 0944    Visit Number 10   Number of Visits 13   Date for PT Re-Evaluation 07/21/17   Authorization Type [employer]- KX at visit 15   PT Start Time 0930   PT Stop Time 1010   PT Time Calculation (min) 40 min      Past Medical History:  Diagnosis Date  . Abdominal pain   . Arthritis   . Cancer ([place])    PROSTATE CANCER 3 YRS AGO  . Hernia     Past Surgical History:  Procedure Laterality Date  . HERNIA REPAIR    . INGUINAL HERNIA REPAIR  10/07/2011   Procedure: HERNIA REPAIR INGUINAL ADULT;  Surgeon: Odis Hollingshead, MD;  Location: [place];  Service: General;  Laterality: Left;  Left Inguinal Repair with Mesh  . INSERTION PROSTATE RADIATION SEED  December 2010   seed implants   . KNEE CARTILAGE SURGERY  6/12   arthritis and cartilage removal     There were no vitals filed for this visit.      Subjective Assessment - 07/21/17 0941    Subjective Alot of pain the last couple of days. Intermittent, late in the day. None now. Stiff in low back/painful every moring 3-4/10 which subsides after 1 hour with exercsies and getting moving. Still have left sided piriformis pain that comes in attacks up to 8/10, stretches help for a period of time from seconds to hours. Over the last couple of days, I feel like I am back where I started.    Currently in Pain? No/denies  up to 8/10 intermittently             [facility] PT Assessment - 07/21/17 0001      ROM / Strength   AROM / PROM / Strength Strength     AROM   Left Knee Extension -15     Strength   Overall Strength Comments Bilateral hip abduction 4/5 , hip  extension 3+/5                                PT Short Term Goals - 07/21/17 1011      PT SHORT TERM GOAL #1   Title Knee ext improved by at least 5 deg to decrease LLD that has resulted from flexion contracture by 8/10   Baseline -15   Time 3   Period Weeks   Status On-going     PT SHORT TERM GOAL #2   Title Pt will verbalize incoorporating exercises into daily activities rather than waiting for pain to do stretches   Baseline has stopped due to pain exacerbation   Time 3   Period Weeks   Status On-going           PT Long Term Goals - 07/21/17 1011      PT LONG TERM GOAL #1   Title Resolution of sharp pain in L piriformis for at least 3 days in a row by 8/31   Baseline Last two dasy 2-3 times per day, before it has gotten to where 3 days without attack.  Time 6   Period Weeks   Status On-going     PT LONG TERM GOAL #2   Title FOTO to 29% limitation to indicate significant improvement in functional ability   Baseline 43% limitation at eval, 37% status on 07/10/2017   Time 6   Period Weeks   Status On-going     PT LONG TERM GOAL #3   Title Pt will demo proper form for bending/squating for objects on floor   Time 6   Period Weeks   Status On-going     PT LONG TERM GOAL #4   Title all hip MMT to 5/5 grossly to provide proper support to lumbopelvic region   Baseline 3+/5 to 4/5   Time 6   Period Weeks   Status On-going               Plan - 07/21/17 1028    Clinical Impression Statement Pt arrives at end of POC stating he felt he was getting better until a flare up during his PT session 2 visits ago where he left in severe pain and has recurrent pain since. He has slacked off on his HEP due to the exacerbation and feels hs is back to his starting pain at eval. He would like to continue PT x 2 weeks to transition to PT Mat class for LBP and possibly reformer classes in community afterward. HE felt the core exercises were beneficial. He  reports being depressed since his pain exacerbation and wants to get back to feeling better. See objective measures for strength and flexibility. No additional goals met at this time.    PT Next Visit Plan ERO for 2 weeks, , transition reformer/tower exercises to table for HEP,  ( did not like tower exercise in sitting-too much pressure on piriformis)    PT Home Exercise Plan figure 4 piriformis stretch, seated hamstring stretch, tennis ball trigger point release; clams, bridge with clam-green, gastroc stretch, supine hamstring stretch; glut set, prone hip ext. decompression, prone quad stretch, hip hike on step;    Consulted and Agree with Plan of Care Patient      Patient will benefit from skilled therapeutic intervention in order to improve the following deficits and impairments:  Decreased range of motion, Increased muscle spasms, Decreased activity tolerance, Pain, Improper body mechanics, Impaired flexibility, Hypomobility, Decreased strength, Postural dysfunction  Visit Diagnosis: Pain in left hip  Stiffness of left hip, not elsewhere classified     Problem List There are no active problems to display for this patient.   Dorene Ar, [place] 07/21/2017, 10:56 AM  [facility] [address] [place], [place], [address] Phone: [phone]   Fax:  [phone]  Name: [name] MRN: [id number] Date of Birth: 1946-09-06

## 2017-07-27 ENCOUNTER — Encounter: Payer: Self-pay | Admitting: Physical Therapy

## 2017-07-27 ENCOUNTER — Ambulatory Visit: Payer: PPO | Attending: Internal Medicine | Admitting: Physical Therapy

## 2017-07-27 DIAGNOSIS — M25552 Pain in left hip: Secondary | ICD-10-CM | POA: Insufficient documentation

## 2017-07-27 DIAGNOSIS — M25652 Stiffness of left hip, not elsewhere classified: Secondary | ICD-10-CM | POA: Insufficient documentation

## 2017-07-27 NOTE — Therapy (Signed)
[place], [place], [address] Phone: [phone]   Fax:  [phone]  Physical Therapy Treatment/ERO  Patient Details  Name: [name] MRN: [id number] Date of Birth: 1946-10-08 Referring Provider: Wenda Low, MD  Encounter Date: 07/27/2017      PT End of Session - 07/27/17 1019    Visit Number 11   Number of Visits 15   Date for PT Re-Evaluation 08/11/17   Authorization Type [employer]- KX at visit 15   PT Start Time 1017   PT Stop Time 1100   PT Time Calculation (min) 43 min   Activity Tolerance Patient tolerated treatment well   Behavior During Therapy [facility] for tasks assessed/performed      Past Medical History:  Diagnosis Date  . Abdominal pain   . Arthritis   . Cancer ([place])    PROSTATE CANCER 3 YRS AGO  . Hernia     Past Surgical History:  Procedure Laterality Date  . HERNIA REPAIR    . INGUINAL HERNIA REPAIR  10/07/2011   Procedure: HERNIA REPAIR INGUINAL ADULT;  Surgeon: Odis Hollingshead, MD;  Location: [place];  Service: General;  Laterality: Left;  Left Inguinal Repair with Mesh  . INSERTION PROSTATE RADIATION SEED  December 2010   seed implants   . KNEE CARTILAGE SURGERY  6/12   arthritis and cartilage removal     There were no vitals filed for this visit.      Subjective Assessment - 07/27/17 1019    Subjective Exercises he does in bed in the morning are helpful to decrease LBP. Still gets "attacks" but are less frequent, not as debilitating as it was. Had pain after doing seated exercises on reformer.  No attacks in last week or so. L knee continues to bother him some, gets clicking during bird dog. Feels that reformer has been good to strengthen core and feels stronger.    Patient Stated Goals decrease pain   Currently in Pain? No/denies        Pilates Reformer used for LE/core strength, postural strength, lumbopelvic disassociation and core control.   Exercises included: Footwork- 2R1B, neutral arches & ER on heels, in PF, heel raises Bridging- 1R1B static Supine Arm work- 2R1B flx/ext & circles Quadruped- 1R1B      [facility] PT Assessment - 07/27/17 0001      Assessment   Medical Diagnosis LBP   Referring Provider Wenda Low, MD   Onset Date/Surgical Date --  chronic    seated figure 4 stretch                         PT Education - 07/27/17 1049    Education provided Yes   Education Details discussing progression and mainenance post PT, exercise form/rationale, POC   Person(s) Educated Patient   Methods Explanation;Demonstration;Tactile cues;Verbal cues   Comprehension Verbalized understanding;Returned demonstration;Verbal cues required;Tactile cues required;Need further instruction          PT Short Term Goals - 07/21/17 1011      PT SHORT TERM GOAL #1   Title Knee ext improved by at least 5 deg to decrease LLD that has resulted from flexion contracture by 8/10   Baseline -15   Time 3   Period Weeks   Status On-going     PT SHORT TERM GOAL #2   Title Pt will verbalize incoorporating exercises into daily activities rather than waiting for pain to  do stretches   Baseline has stopped due to pain exacerbation   Time 3   Period Weeks   Status On-going           PT Long Term Goals - 07/21/17 1011      PT LONG TERM GOAL #1   Title Resolution of sharp pain in L piriformis for at least 3 days in a row by 8/31   Baseline Last two dasy 2-3 times per day, before it has gotten to where 3 days without attack.    Time 6   Period Weeks   Status On-going     PT LONG TERM GOAL #2   Title FOTO to 29% limitation to indicate significant improvement in functional ability   Baseline 43% limitation at eval, 37% status on 07/10/2017   Time 6   Period Weeks   Status On-going     PT LONG TERM GOAL #3   Title Pt will demo proper form for bending/squating for objects on floor   Time 6   Period Weeks    Status On-going     PT LONG TERM GOAL #4   Title all hip MMT to 5/5 grossly to provide proper support to lumbopelvic region   Baseline 3+/5 to 4/5   Time 6   Period Weeks   Status On-going               Plan - 07/27/17 1103    Clinical Impression Statement Discussed importance of continued maintenance after gains made in PT treatments as well as setbacks that will occur due to daily activities that he performs that are not necessarily ergonomical. Pt was understanding and verbalized preparation to begin transition to independent program. Will continue to utilize reformer for strength challenges. Pt will benefit from further therapy to promote lumbopelvic stabilization and transition to independent long term program.    PT Treatment/Interventions ADLs/Self Care Home Management;Cryotherapy;Electrical Stimulation;Iontophoresis 4mg /ml Dexamethasone;Functional mobility training;Stair training;Gait training;Ultrasound;Traction;Moist Heat;Therapeutic activities;Therapeutic exercise;Balance training;Neuromuscular re-education;Patient/family education;Passive range of motion;Manual techniques;Dry needling;Taping   PT Next Visit Plan transition reformer/tower exercises to table for HEP,  ( did not like tower exercise in sitting-too much pressure on piriformis)    PT Home Exercise Plan figure 4 piriformis stretch, seated hamstring stretch, tennis ball trigger point release; clams, bridge with clam-green, gastroc stretch, supine hamstring stretch; glut set, prone hip ext. decompression, prone quad stretch, hip hike on step;    Consulted and Agree with Plan of Care Patient      Patient will benefit from skilled therapeutic intervention in order to improve the following deficits and impairments:  Decreased range of motion, Increased muscle spasms, Decreased activity tolerance, Pain, Improper body mechanics, Impaired flexibility, Hypomobility, Decreased strength, Postural dysfunction  Visit  Diagnosis: Pain in left hip - Plan: PT plan of care cert/re-cert  Stiffness of left hip, not elsewhere classified - Plan: PT plan of care cert/re-cert     Problem List There are no active problems to display for this patient.   Tarig Zimmers C. Marylan Glore PT, DPT 07/27/17 12:11 PM   [facility] [address] [place], [place], [address] Phone: [phone]   Fax:  [phone]  Name: [name] MRN: [id number] Date of Birth: December 15, 1945

## 2017-08-01 ENCOUNTER — Ambulatory Visit: Payer: PPO | Admitting: Physical Therapy

## 2017-08-01 ENCOUNTER — Encounter: Payer: Self-pay | Admitting: Physical Therapy

## 2017-08-01 DIAGNOSIS — M25552 Pain in left hip: Secondary | ICD-10-CM | POA: Diagnosis not present

## 2017-08-01 DIAGNOSIS — M25652 Stiffness of left hip, not elsewhere classified: Secondary | ICD-10-CM

## 2017-08-01 NOTE — Therapy (Signed)
[name], [place], [address] Phone: [phone]   Fax:  [phone]  Physical Therapy Treatment  Patient Details  Name: [name] MRN: [id number] Date of Birth: 1946-06-04 Referring Provider: Wenda Low, MD  Encounter Date: 08/01/2017      PT End of Session - 08/01/17 1549    Visit Number 12   Number of Visits 15   Date for PT Re-Evaluation 08/11/17   Authorization Type [employer]- KX at visit 15   PT Start Time 5916   PT Stop Time 1627   PT Time Calculation (min) 40 min   Activity Tolerance Patient tolerated treatment well   Behavior During Therapy [facility] for tasks assessed/performed      Past Medical History:  Diagnosis Date  . Abdominal pain   . Arthritis   . Cancer ([place])    PROSTATE CANCER 3 YRS AGO  . Hernia     Past Surgical History:  Procedure Laterality Date  . HERNIA REPAIR    . INGUINAL HERNIA REPAIR  10/07/2011   Procedure: HERNIA REPAIR INGUINAL ADULT;  Surgeon: Odis Hollingshead, MD;  Location: [facility];  Service: General;  Laterality: Left;  Left Inguinal Repair with Mesh  . INSERTION PROSTATE RADIATION SEED  December 2010   seed implants   . KNEE CARTILAGE SURGERY  6/12   arthritis and cartilage removal     There were no vitals filed for this visit.      Subjective Assessment - 08/01/17 1549    Subjective Pt reports L knee was really bothering him over the weekend. Was able to cut grass yesterday. Thinks he is ready to see an MD about his knee. Reports soreness on R hip next day after last visit.    Currently in Pain? No/denies                         [facility] Adult PT Treatment/Exercise - 08/01/17 0001      Knee/Hip Exercises: Stretches   Gastroc Stretch 2 reps;30 seconds   Gastroc Stretch Limitations slant board     Knee/Hip Exercises: Aerobic   Stationary Bike 7 min L3      Pilates Reformer used for LE/core strength, postural  strength, lumbopelvic disassociation and core control.  Exercises included: Footwork 2R1B Bridging  1R1B  Static & with press Quadruped- plank press out 2R1B, Qped on box press 1R1B Prone long box 1R1B Figure 4 stretch            PT Education - 08/01/17 1627    Education provided Yes   Education Details HEP, using principles taught in new exercises   Person(s) Educated Patient   Methods Explanation;Demonstration;Tactile cues;Verbal cues   Comprehension Verbalized understanding;Returned demonstration;Verbal cues required;Tactile cues required;Need further instruction          PT Short Term Goals - 07/21/17 1011      PT SHORT TERM GOAL #1   Title Knee ext improved by at least 5 deg to decrease LLD that has resulted from flexion contracture by 8/10   Baseline -15   Time 3   Period Weeks   Status On-going     PT SHORT TERM GOAL #2   Title Pt will verbalize incoorporating exercises into daily activities rather than waiting for pain to do stretches   Baseline has stopped due to pain exacerbation   Time 3   Period Weeks   Status On-going  PT Long Term Goals - 07/21/17 1011      PT LONG TERM GOAL #1   Title Resolution of sharp pain in L piriformis for at least 3 days in a row by 8/31   Baseline Last two dasy 2-3 times per day, before it has gotten to where 3 days without attack.    Time 6   Period Weeks   Status On-going     PT LONG TERM GOAL #2   Title FOTO to 29% limitation to indicate significant improvement in functional ability   Baseline 43% limitation at eval, 37% status on 07/10/2017   Time 6   Period Weeks   Status On-going     PT LONG TERM GOAL #3   Title Pt will demo proper form for bending/squating for objects on floor   Time 6   Period Weeks   Status On-going     PT LONG TERM GOAL #4   Title all hip MMT to 5/5 grossly to provide proper support to lumbopelvic region   Baseline 3+/5 to 4/5   Time 6   Period Weeks   Status On-going                Plan - 08/01/17 1628    Clinical Impression Statement good tolerance to exercise today, fatigue notable. Cues cont to be required for coordinated activation of core/gluts.    PT Treatment/Interventions ADLs/Self Care Home Management;Cryotherapy;Electrical Stimulation;Iontophoresis 4mg /ml Dexamethasone;Functional mobility training;Stair training;Gait training;Ultrasound;Traction;Moist Heat;Therapeutic activities;Therapeutic exercise;Balance training;Neuromuscular re-education;Patient/family education;Passive range of motion;Manual techniques;Dry needling;Taping   PT Next Visit Plan transition reformer/tower exercises to table for HEP,  ( did not like tower exercise in sitting-too much pressure on piriformis)    PT Home Exercise Plan figure 4 piriformis stretch, seated hamstring stretch, tennis ball trigger point release; clams, bridge with clam-green, gastroc stretch, supine hamstring stretch; glut set, prone hip ext. decompression, prone quad stretch, hip hike on step;    Consulted and Agree with Plan of Care Patient      Patient will benefit from skilled therapeutic intervention in order to improve the following deficits and impairments:  Decreased range of motion, Increased muscle spasms, Decreased activity tolerance, Pain, Improper body mechanics, Impaired flexibility, Hypomobility, Decreased strength, Postural dysfunction  Visit Diagnosis: Pain in left hip  Stiffness of left hip, not elsewhere classified     Problem List There are no active problems to display for this patient.   [name] [name] PT, DPT 08/01/17 4:29 PM   [facility] [address] [place], [place], [address] Phone: [phone]   Fax:  [phone]  Name: [name] MRN: [id number] Date of Birth: [birth year]

## 2017-08-03 ENCOUNTER — Ambulatory Visit: Payer: PPO | Admitting: Physical Therapy

## 2017-08-03 ENCOUNTER — Encounter: Payer: Self-pay | Admitting: Physical Therapy

## 2017-08-03 DIAGNOSIS — M25652 Stiffness of left hip, not elsewhere classified: Secondary | ICD-10-CM

## 2017-08-03 DIAGNOSIS — M25552 Pain in left hip: Secondary | ICD-10-CM

## 2017-08-03 NOTE — Therapy (Signed)
[place], [place], [address] Phone: [phone]   Fax:  [phone]  Physical Therapy Treatment  Patient Details  Name: [name] MRN: [id number] Date of Birth: Jan 24, 1946 Referring Provider: Wenda Low, MD  Encounter Date: 08/03/2017      PT End of Session - 08/03/17 1014    Visit Number 13   Number of Visits 15   Date for PT Re-Evaluation 08/11/17   Authorization Type [employer]- KX at visit 15   PT Start Time 6834   PT Stop Time 1052   PT Time Calculation (min) 38 min   Activity Tolerance Patient tolerated treatment well   Behavior During Therapy [facility] for tasks assessed/performed      Past Medical History:  Diagnosis Date  . Abdominal pain   . Arthritis   . Cancer ([place])    PROSTATE CANCER 3 YRS AGO  . Hernia     Past Surgical History:  Procedure Laterality Date  . HERNIA REPAIR    . INGUINAL HERNIA REPAIR  10/07/2011   Procedure: HERNIA REPAIR INGUINAL ADULT;  Surgeon: Odis Hollingshead, MD;  Location: [place];  Service: General;  Laterality: Left;  Left Inguinal Repair with Mesh  . INSERTION PROSTATE RADIATION SEED  December 2010   seed implants   . KNEE CARTILAGE SURGERY  6/12   arthritis and cartilage removal     There were no vitals filed for this visit.      Subjective Assessment - 08/03/17 1014    Subjective Knee is still bothering him a little bit. Denies attacks in hip. pain when sitting on a bench yesterday.    Currently in Pain? No/denies                         [facility] Adult PT Treatment/Exercise - 08/03/17 0001      Knee/Hip Exercises: Aerobic   Elliptical 7 min L1 ramp 10      Reformer: Foot work 2R1B, bilat/neutral, single leg/neutral Bridging 1R1B green tband at knees, with press away Active HSS x5 each Sidelying feet in straps 1R1B, TT press, TT press with turnout, straight leg arcs Figure 4 stretch Supine feet in  straps 1R1B in turnout: circles, SLR, TT press Arm work 1R1B legs ext with press Standing quad stretch       PT Short Term Goals - 07/21/17 1011      PT SHORT TERM GOAL #1   Title Knee ext improved by at least 5 deg to decrease LLD that has resulted from flexion contracture by 8/10   Baseline -15   Time 3   Period Weeks   Status On-going     PT SHORT TERM GOAL #2   Title Pt will verbalize incoorporating exercises into daily activities rather than waiting for pain to do stretches   Baseline has stopped due to pain exacerbation   Time 3   Period Weeks   Status On-going           PT Long Term Goals - 07/21/17 1011      PT LONG TERM GOAL #1   Title Resolution of sharp pain in L piriformis for at least 3 days in a row by 8/31   Baseline Last two dasy 2-3 times per day, before it has gotten to where 3 days without attack.    Time 6   Period Weeks   Status On-going     PT LONG  TERM GOAL #2   Title FOTO to 29% limitation to indicate significant improvement in functional ability   Baseline 43% limitation at eval, 37% status on 07/10/2017   Time 6   Period Weeks   Status On-going     PT LONG TERM GOAL #3   Title Pt will demo proper form for bending/squating for objects on floor   Time 6   Period Weeks   Status On-going     PT LONG TERM GOAL #4   Title all hip MMT to 5/5 grossly to provide proper support to lumbopelvic region   Baseline 3+/5 to 4/5   Time 6   Period Weeks   Status On-going               Plan - 08/03/17 1053    Clinical Impression Statement Challenged by exercises. Notable fatigue in sidlying feet in straps rather than press from platform due to increased control from hips required. Is not able to reach to grab back of shoe while in standing quad stretch. Discussed knee pain and how strenthening will be good for knee as well.    PT Treatment/Interventions ADLs/Self Care Home Management;Cryotherapy;Electrical Stimulation;Iontophoresis 4mg /ml  Dexamethasone;Functional mobility training;Stair training;Gait training;Ultrasound;Traction;Moist Heat;Therapeutic activities;Therapeutic exercise;Balance training;Neuromuscular re-education;Patient/family education;Passive range of motion;Manual techniques;Dry needling;Taping   PT Next Visit Plan transition reformer/tower exercises to table for HEP,  ( did not like tower exercise in sitting-too much pressure on piriformis)    PT Home Exercise Plan figure 4 piriformis stretch, seated hamstring stretch, tennis ball trigger point release; clams, bridge with clam-green, gastroc stretch, supine hamstring stretch; glut set, prone hip ext. decompression, prone quad stretch, hip hike on step;    Consulted and Agree with Plan of Care Patient      Patient will benefit from skilled therapeutic intervention in order to improve the following deficits and impairments:  Decreased range of motion, Increased muscle spasms, Decreased activity tolerance, Pain, Improper body mechanics, Impaired flexibility, Hypomobility, Decreased strength, Postural dysfunction  Visit Diagnosis: Pain in left hip  Stiffness of left hip, not elsewhere classified     Problem List There are no active problems to display for this patient.   Heather Mckendree C. Leyton Brownlee PT, DPT 08/03/17 10:54 AM   [place] [facility] [address] [place], [place], [address] Phone: [phone]   Fax:  [phone]  Name: JOWELL BOSSI MRN: [id number] Date of Birth: 11/09/1946

## 2017-08-07 ENCOUNTER — Ambulatory Visit: Payer: PPO | Admitting: Physical Therapy

## 2017-08-08 ENCOUNTER — Encounter: Payer: Self-pay | Admitting: Physical Therapy

## 2017-08-09 ENCOUNTER — Ambulatory Visit: Payer: PPO | Admitting: Physical Therapy

## 2017-08-10 ENCOUNTER — Ambulatory Visit: Payer: PPO | Admitting: Physical Therapy

## 2017-08-10 ENCOUNTER — Encounter: Payer: Self-pay | Admitting: Physical Therapy

## 2017-08-10 DIAGNOSIS — M25552 Pain in left hip: Secondary | ICD-10-CM | POA: Diagnosis not present

## 2017-08-10 DIAGNOSIS — M25652 Stiffness of left hip, not elsewhere classified: Secondary | ICD-10-CM

## 2017-08-10 NOTE — Therapy (Signed)
[facility] [address] [place], [place], [address] Phone: [phone]   Fax:  [phone]  Physical Therapy Treatment/Discharge Summary  Patient Details  Name: [name] MRN: [id number] Date of Birth: 1946-09-30 Referring Provider: Georgann Housekeeper, MD  Encounter Date: 08/10/2017      PT End of Session - 08/10/17 1058    Visit Number 14   Number of Visits 15   Date for PT Re-Evaluation 08/11/17   Authorization Type [employer]- KX at visit 15   PT Start Time 1100      Past Medical History:  Diagnosis Date  . Abdominal pain   . Arthritis   . Cancer ([facility])    PROSTATE CANCER 3 YRS AGO  . Hernia     Past Surgical History:  Procedure Laterality Date  . HERNIA REPAIR    . INGUINAL HERNIA REPAIR  10/07/2011   Procedure: HERNIA REPAIR INGUINAL ADULT;  Surgeon: Adolph Pollack, MD;  Location: [facility];  Service: General;  Laterality: Left;  Left Inguinal Repair with Mesh  . INSERTION PROSTATE RADIATION SEED  December 2010   seed implants   . KNEE CARTILAGE SURGERY  6/12   arthritis and cartilage removal     There were no vitals filed for this visit.      Subjective Assessment - 08/10/17 1109    Subjective Severe attacks have not happened in a few weeks. Still some soreness in a couple of spots when I sit on them.    Currently in Pain? No/denies            [facility] PT Assessment - 08/10/17 0001      Assessment   Medical Diagnosis LBP   Referring Provider Georgann Housekeeper, MD                     [facility] Adult PT Treatment/Exercise - 08/10/17 0001      Knee/Hip Exercises: Aerobic   Elliptical 5 min L1 ramp 10      Slant board stretch Reformer: Foot work- 2R1B, neutral & marching; turnout 2R1B1Y Bridging- 1R1B static & press away Hands in straps 2R1B  Feet in straps 2R1B Figure 4 stretch Sidelying press from platform 2R1B Sidelying foot in strap 1R1B        PT Short Term  Goals - 08/10/17 1111      PT SHORT TERM GOAL #1   Title Knee ext improved by at least 5 deg to decrease LLD that has resulted from flexion contracture by 8/10   Baseline contracture continued   Status Not Met     PT SHORT TERM GOAL #2   Title Pt will verbalize incoorporating exercises into daily activities rather than waiting for pain to do stretches   Baseline able   Status Achieved           PT Long Term Goals - 08/10/17 1101      PT LONG TERM GOAL #1   Title Resolution of sharp pain in L piriformis for at least 3 days in a row by 8/31   Baseline has not had an attack in a few weeks, some pain from time to time   Status Achieved     PT LONG TERM GOAL #2   Title FOTO to 29% limitation to indicate significant improvement in functional ability   Baseline 17% limited   Status Achieved     PT LONG TERM GOAL #3   Title Pt will demo proper form  for bending/squating for objects on floor   Baseline able to demo   Status Achieved     PT LONG TERM GOAL #4   Title all hip MMT to 5/5 grossly to provide proper support to lumbopelvic region   Baseline abduction 5/5 bilat, hip ext 5/5   Status Achieved             Patient will benefit from skilled therapeutic intervention in order to improve the following deficits and impairments:     Visit Diagnosis: No diagnosis found.     Problem List There are no active problems to display for this patient.  PHYSICAL THERAPY DISCHARGE SUMMARY  Visits from Start of Care: 14  Current functional level related to goals / functional outcomes: See above   Remaining deficits: See above   Education / Equipment: Anatomy of condition, POC, HEP, exercise form/rationale  Plan: Patient agrees to discharge.  Patient goals were met. Patient is being discharged due to meeting the stated rehab goals.  ?????     Javarious Elsayed C. Delynda Sepulveda PT, DPT 08/10/17 11:52 AM   [facility] [address] [place], [place], [address] Phone: [phone]   Fax:  [phone]  Name: KAIDENCE CALLAWAY MRN: [id number] Date of Birth: [birth year]

## 2017-08-14 DIAGNOSIS — L821 Other seborrheic keratosis: Secondary | ICD-10-CM | POA: Diagnosis not present

## 2017-08-14 DIAGNOSIS — L814 Other melanin hyperpigmentation: Secondary | ICD-10-CM | POA: Diagnosis not present

## 2017-08-14 DIAGNOSIS — D225 Melanocytic nevi of trunk: Secondary | ICD-10-CM | POA: Diagnosis not present

## 2017-08-14 DIAGNOSIS — Z85828 Personal history of other malignant neoplasm of skin: Secondary | ICD-10-CM | POA: Diagnosis not present

## 2017-08-14 DIAGNOSIS — Z23 Encounter for immunization: Secondary | ICD-10-CM | POA: Diagnosis not present

## 2017-08-14 DIAGNOSIS — L820 Inflamed seborrheic keratosis: Secondary | ICD-10-CM | POA: Diagnosis not present

## 2017-08-14 DIAGNOSIS — L570 Actinic keratosis: Secondary | ICD-10-CM | POA: Diagnosis not present

## 2017-08-14 DIAGNOSIS — D1801 Hemangioma of skin and subcutaneous tissue: Secondary | ICD-10-CM | POA: Diagnosis not present

## 2017-10-04 DIAGNOSIS — Z23 Encounter for immunization: Secondary | ICD-10-CM | POA: Diagnosis not present

## 2017-11-15 IMAGING — CR DG HIP (WITH OR WITHOUT PELVIS) 2-3V*L*
2 series · 2 of 2 positions shown · non-contrast
Comparison: None.

CLINICAL DATA: Chronic pain.  History of prostate carcinoma.

EXAM:
DG HIP  2-3V LEFT

[w pelvis]
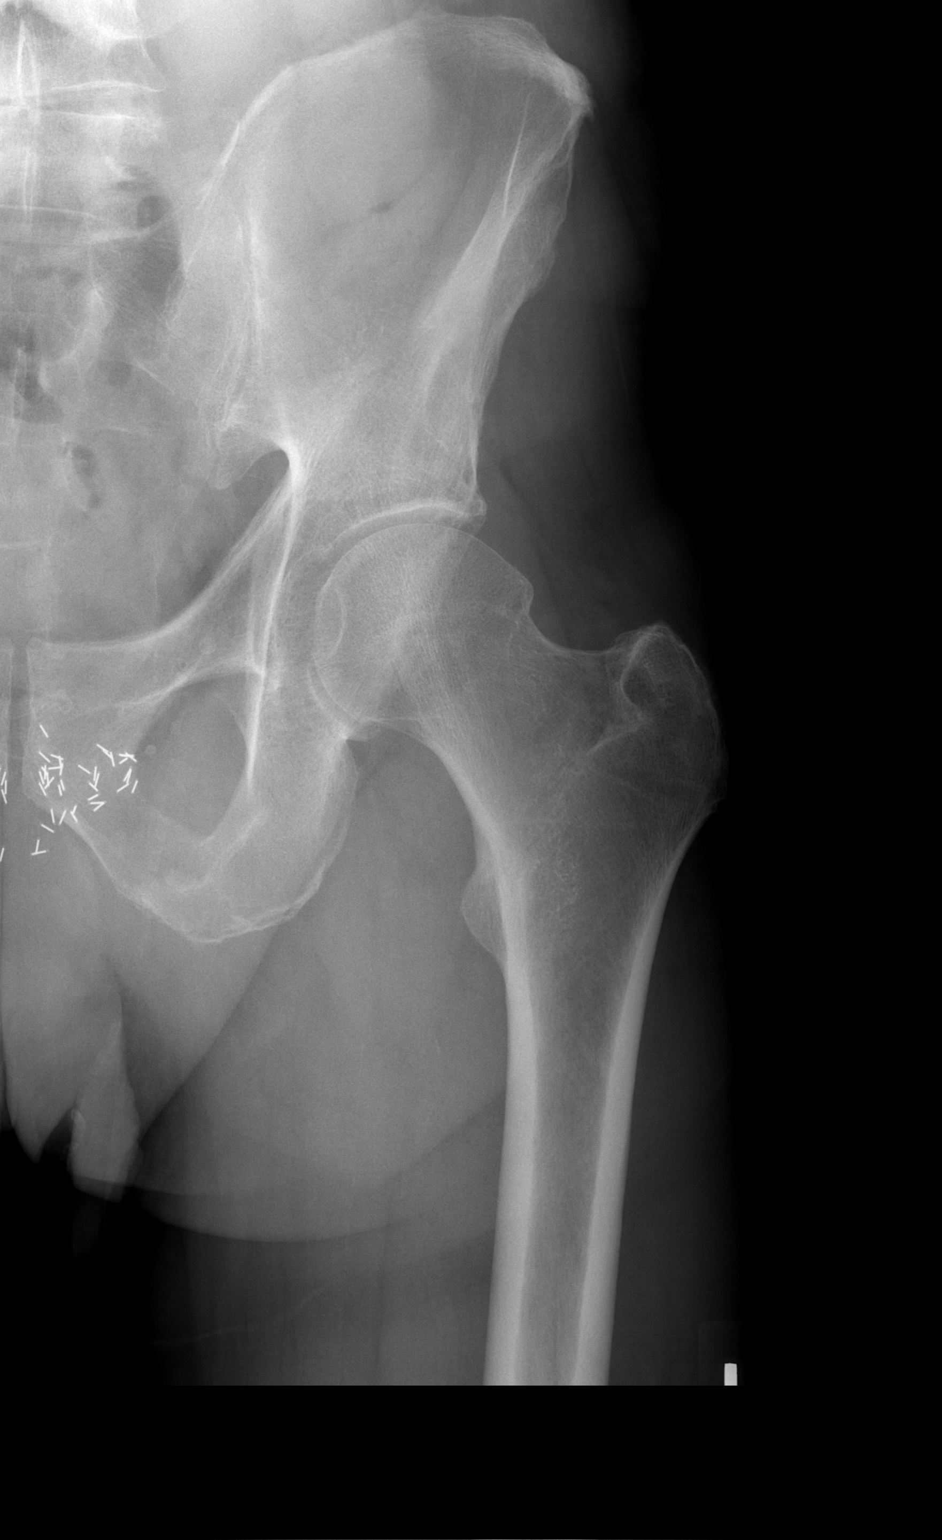

[t hip frog leg left]
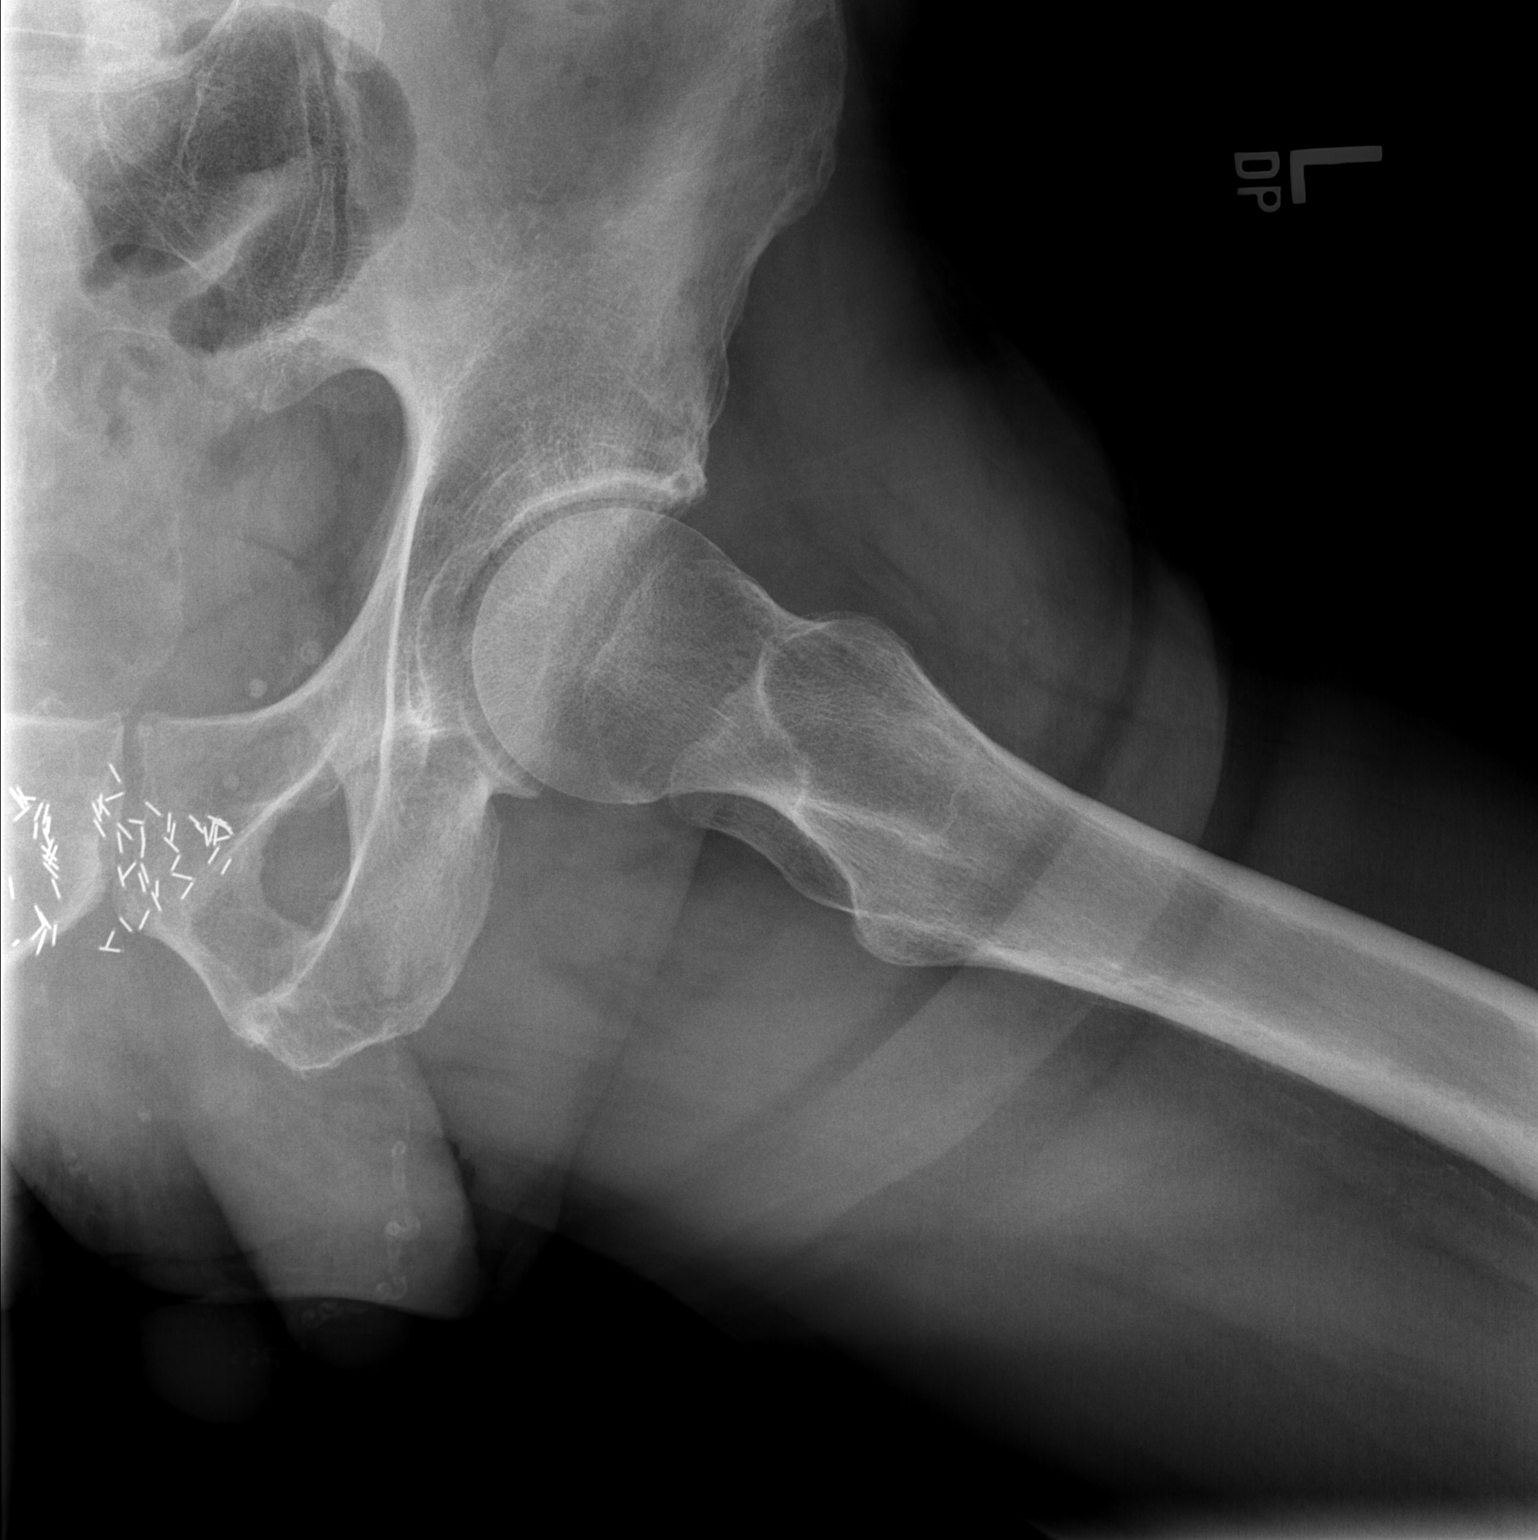

[2 of 2 positions shown; findings below may reference images not displayed]

FINDINGS: Frontal and lateral views were obtained. No fracture or dislocation.
Joint spaces appear normal. No erosive change. No blastic or lytic
bone lesions evident. There are seed implants in the prostate
region. There is calcification in each spermatic artery.
IMPRESSION: No fracture or dislocation. No evident arthropathic change. No
evident blastic or lytic bone lesions.

## 2017-11-29 DIAGNOSIS — L821 Other seborrheic keratosis: Secondary | ICD-10-CM | POA: Diagnosis not present

## 2017-11-29 DIAGNOSIS — L570 Actinic keratosis: Secondary | ICD-10-CM | POA: Diagnosis not present

## 2017-11-29 DIAGNOSIS — Z23 Encounter for immunization: Secondary | ICD-10-CM | POA: Diagnosis not present

## 2017-12-26 DIAGNOSIS — Z23 Encounter for immunization: Secondary | ICD-10-CM | POA: Diagnosis not present

## 2017-12-26 DIAGNOSIS — D485 Neoplasm of uncertain behavior of skin: Secondary | ICD-10-CM | POA: Diagnosis not present

## 2017-12-26 DIAGNOSIS — C4442 Squamous cell carcinoma of skin of scalp and neck: Secondary | ICD-10-CM | POA: Diagnosis not present

## 2018-01-08 DIAGNOSIS — Z8546 Personal history of malignant neoplasm of prostate: Secondary | ICD-10-CM | POA: Diagnosis not present

## 2018-01-08 DIAGNOSIS — A630 Anogenital (venereal) warts: Secondary | ICD-10-CM | POA: Diagnosis not present

## 2018-01-08 DIAGNOSIS — R351 Nocturia: Secondary | ICD-10-CM | POA: Diagnosis not present

## 2018-01-08 DIAGNOSIS — N401 Enlarged prostate with lower urinary tract symptoms: Secondary | ICD-10-CM | POA: Diagnosis not present

## 2018-01-08 DIAGNOSIS — N5201 Erectile dysfunction due to arterial insufficiency: Secondary | ICD-10-CM | POA: Diagnosis not present

## 2018-01-23 ENCOUNTER — Other Ambulatory Visit: Payer: Self-pay | Admitting: Internal Medicine

## 2018-01-23 ENCOUNTER — Ambulatory Visit
Admission: RE | Admit: 2018-01-23 | Discharge: 2018-01-23 | Disposition: A | Discharge status: Home / Self Care | Payer: PPO | Source: Ambulatory Visit | Attending: Internal Medicine | Admitting: Internal Medicine

## 2018-01-23 DIAGNOSIS — M5136 Other intervertebral disc degeneration, lumbar region: Secondary | ICD-10-CM | POA: Diagnosis not present

## 2018-01-23 DIAGNOSIS — Z0000 Encounter for general adult medical examination without abnormal findings: Secondary | ICD-10-CM | POA: Diagnosis not present

## 2018-01-23 DIAGNOSIS — M5489 Other dorsalgia: Secondary | ICD-10-CM

## 2018-01-23 DIAGNOSIS — F322 Major depressive disorder, single episode, severe without psychotic features: Secondary | ICD-10-CM | POA: Diagnosis not present

## 2018-01-23 DIAGNOSIS — Z23 Encounter for immunization: Secondary | ICD-10-CM | POA: Diagnosis not present

## 2018-01-23 DIAGNOSIS — Z1389 Encounter for screening for other disorder: Secondary | ICD-10-CM | POA: Diagnosis not present

## 2018-01-23 DIAGNOSIS — R7309 Other abnormal glucose: Secondary | ICD-10-CM | POA: Diagnosis not present

## 2018-01-23 DIAGNOSIS — C61 Malignant neoplasm of prostate: Secondary | ICD-10-CM | POA: Diagnosis not present

## 2018-01-23 DIAGNOSIS — N400 Enlarged prostate without lower urinary tract symptoms: Secondary | ICD-10-CM | POA: Diagnosis not present

## 2018-01-23 DIAGNOSIS — M545 Low back pain: Secondary | ICD-10-CM | POA: Diagnosis not present

## 2018-02-05 DIAGNOSIS — C4442 Squamous cell carcinoma of skin of scalp and neck: Secondary | ICD-10-CM | POA: Diagnosis not present

## 2018-02-05 DIAGNOSIS — L905 Scar conditions and fibrosis of skin: Secondary | ICD-10-CM | POA: Diagnosis not present

## 2018-02-12 DIAGNOSIS — M79605 Pain in left leg: Secondary | ICD-10-CM | POA: Diagnosis not present

## 2018-02-12 DIAGNOSIS — M545 Low back pain: Secondary | ICD-10-CM | POA: Diagnosis not present

## 2018-02-12 DIAGNOSIS — M79604 Pain in right leg: Secondary | ICD-10-CM | POA: Diagnosis not present

## 2018-02-13 ENCOUNTER — Ambulatory Visit: Payer: PPO

## 2018-02-15 DIAGNOSIS — M79604 Pain in right leg: Secondary | ICD-10-CM | POA: Diagnosis not present

## 2018-02-15 DIAGNOSIS — M545 Low back pain: Secondary | ICD-10-CM | POA: Diagnosis not present

## 2018-02-15 DIAGNOSIS — M79605 Pain in left leg: Secondary | ICD-10-CM | POA: Diagnosis not present

## 2018-02-20 DIAGNOSIS — M79604 Pain in right leg: Secondary | ICD-10-CM | POA: Diagnosis not present

## 2018-02-20 DIAGNOSIS — M79605 Pain in left leg: Secondary | ICD-10-CM | POA: Diagnosis not present

## 2018-02-20 DIAGNOSIS — M545 Low back pain: Secondary | ICD-10-CM | POA: Diagnosis not present

## 2018-02-21 DIAGNOSIS — D0439 Carcinoma in situ of skin of other parts of face: Secondary | ICD-10-CM | POA: Diagnosis not present

## 2018-02-21 DIAGNOSIS — D485 Neoplasm of uncertain behavior of skin: Secondary | ICD-10-CM | POA: Diagnosis not present

## 2018-02-22 DIAGNOSIS — M545 Low back pain: Secondary | ICD-10-CM | POA: Diagnosis not present

## 2018-02-22 DIAGNOSIS — M79605 Pain in left leg: Secondary | ICD-10-CM | POA: Diagnosis not present

## 2018-02-22 DIAGNOSIS — M79604 Pain in right leg: Secondary | ICD-10-CM | POA: Diagnosis not present

## 2018-02-26 DIAGNOSIS — M545 Low back pain: Secondary | ICD-10-CM | POA: Diagnosis not present

## 2018-02-26 DIAGNOSIS — M79605 Pain in left leg: Secondary | ICD-10-CM | POA: Diagnosis not present

## 2018-02-26 DIAGNOSIS — M79604 Pain in right leg: Secondary | ICD-10-CM | POA: Diagnosis not present

## 2018-03-01 DIAGNOSIS — M545 Low back pain: Secondary | ICD-10-CM | POA: Diagnosis not present

## 2018-03-01 DIAGNOSIS — M79604 Pain in right leg: Secondary | ICD-10-CM | POA: Diagnosis not present

## 2018-03-01 DIAGNOSIS — M79605 Pain in left leg: Secondary | ICD-10-CM | POA: Diagnosis not present

## 2018-03-05 DIAGNOSIS — M79604 Pain in right leg: Secondary | ICD-10-CM | POA: Diagnosis not present

## 2018-03-05 DIAGNOSIS — M545 Low back pain: Secondary | ICD-10-CM | POA: Diagnosis not present

## 2018-03-05 DIAGNOSIS — M79605 Pain in left leg: Secondary | ICD-10-CM | POA: Diagnosis not present

## 2018-03-08 DIAGNOSIS — M79605 Pain in left leg: Secondary | ICD-10-CM | POA: Diagnosis not present

## 2018-03-08 DIAGNOSIS — M79604 Pain in right leg: Secondary | ICD-10-CM | POA: Diagnosis not present

## 2018-03-08 DIAGNOSIS — M545 Low back pain: Secondary | ICD-10-CM | POA: Diagnosis not present

## 2018-03-13 DIAGNOSIS — M545 Low back pain: Secondary | ICD-10-CM | POA: Diagnosis not present

## 2018-03-13 DIAGNOSIS — M79604 Pain in right leg: Secondary | ICD-10-CM | POA: Diagnosis not present

## 2018-03-13 DIAGNOSIS — M79605 Pain in left leg: Secondary | ICD-10-CM | POA: Diagnosis not present

## 2018-03-15 DIAGNOSIS — M79605 Pain in left leg: Secondary | ICD-10-CM | POA: Diagnosis not present

## 2018-03-15 DIAGNOSIS — M545 Low back pain: Secondary | ICD-10-CM | POA: Diagnosis not present

## 2018-03-15 DIAGNOSIS — M79604 Pain in right leg: Secondary | ICD-10-CM | POA: Diagnosis not present

## 2018-03-22 DIAGNOSIS — M79605 Pain in left leg: Secondary | ICD-10-CM | POA: Diagnosis not present

## 2018-03-22 DIAGNOSIS — M79604 Pain in right leg: Secondary | ICD-10-CM | POA: Diagnosis not present

## 2018-03-22 DIAGNOSIS — M545 Low back pain: Secondary | ICD-10-CM | POA: Diagnosis not present

## 2018-03-26 DIAGNOSIS — C44329 Squamous cell carcinoma of skin of other parts of face: Secondary | ICD-10-CM | POA: Diagnosis not present

## 2018-03-26 DIAGNOSIS — L905 Scar conditions and fibrosis of skin: Secondary | ICD-10-CM | POA: Diagnosis not present

## 2018-04-10 DIAGNOSIS — M79605 Pain in left leg: Secondary | ICD-10-CM | POA: Diagnosis not present

## 2018-04-10 DIAGNOSIS — M545 Low back pain: Secondary | ICD-10-CM | POA: Diagnosis not present

## 2018-04-10 DIAGNOSIS — M79604 Pain in right leg: Secondary | ICD-10-CM | POA: Diagnosis not present

## 2018-04-18 DIAGNOSIS — M79604 Pain in right leg: Secondary | ICD-10-CM | POA: Diagnosis not present

## 2018-04-18 DIAGNOSIS — M79605 Pain in left leg: Secondary | ICD-10-CM | POA: Diagnosis not present

## 2018-04-18 DIAGNOSIS — M545 Low back pain: Secondary | ICD-10-CM | POA: Diagnosis not present

## 2018-04-20 DIAGNOSIS — M545 Low back pain: Secondary | ICD-10-CM | POA: Diagnosis not present

## 2018-04-20 DIAGNOSIS — M79605 Pain in left leg: Secondary | ICD-10-CM | POA: Diagnosis not present

## 2018-04-20 DIAGNOSIS — M79604 Pain in right leg: Secondary | ICD-10-CM | POA: Diagnosis not present

## 2018-04-23 DIAGNOSIS — M79605 Pain in left leg: Secondary | ICD-10-CM | POA: Diagnosis not present

## 2018-04-23 DIAGNOSIS — M79604 Pain in right leg: Secondary | ICD-10-CM | POA: Diagnosis not present

## 2018-04-23 DIAGNOSIS — M545 Low back pain: Secondary | ICD-10-CM | POA: Diagnosis not present

## 2018-04-27 DIAGNOSIS — M79604 Pain in right leg: Secondary | ICD-10-CM | POA: Diagnosis not present

## 2018-04-27 DIAGNOSIS — M545 Low back pain: Secondary | ICD-10-CM | POA: Diagnosis not present

## 2018-04-27 DIAGNOSIS — M79605 Pain in left leg: Secondary | ICD-10-CM | POA: Diagnosis not present

## 2018-05-04 DIAGNOSIS — M79605 Pain in left leg: Secondary | ICD-10-CM | POA: Diagnosis not present

## 2018-05-04 DIAGNOSIS — M545 Low back pain: Secondary | ICD-10-CM | POA: Diagnosis not present

## 2018-05-04 DIAGNOSIS — M79604 Pain in right leg: Secondary | ICD-10-CM | POA: Diagnosis not present

## 2018-05-11 DIAGNOSIS — M79604 Pain in right leg: Secondary | ICD-10-CM | POA: Diagnosis not present

## 2018-05-11 DIAGNOSIS — M79605 Pain in left leg: Secondary | ICD-10-CM | POA: Diagnosis not present

## 2018-05-11 DIAGNOSIS — M545 Low back pain: Secondary | ICD-10-CM | POA: Diagnosis not present

## 2018-05-25 DIAGNOSIS — M5136 Other intervertebral disc degeneration, lumbar region: Secondary | ICD-10-CM | POA: Diagnosis not present

## 2018-05-25 DIAGNOSIS — N400 Enlarged prostate without lower urinary tract symptoms: Secondary | ICD-10-CM | POA: Diagnosis not present

## 2018-05-25 DIAGNOSIS — C61 Malignant neoplasm of prostate: Secondary | ICD-10-CM | POA: Diagnosis not present

## 2018-05-25 DIAGNOSIS — F322 Major depressive disorder, single episode, severe without psychotic features: Secondary | ICD-10-CM | POA: Diagnosis not present

## 2018-05-25 DIAGNOSIS — K219 Gastro-esophageal reflux disease without esophagitis: Secondary | ICD-10-CM | POA: Diagnosis not present

## 2018-06-21 DIAGNOSIS — B009 Herpesviral infection, unspecified: Secondary | ICD-10-CM | POA: Diagnosis not present

## 2018-06-21 DIAGNOSIS — D485 Neoplasm of uncertain behavior of skin: Secondary | ICD-10-CM | POA: Diagnosis not present

## 2018-06-21 DIAGNOSIS — D040 Carcinoma in situ of skin of lip: Secondary | ICD-10-CM | POA: Diagnosis not present

## 2018-07-16 IMAGING — DX DG LUMBAR SPINE 2-3V
3 series · 3 of 3 positions shown · non-contrast
Comparison: CT abdomen pelvis of 08/27/2012 and CT of 12/26/2003

CLINICAL DATA: Increasing low back pain over the last 2-3 months,
no injury

EXAM:
LUMBAR SPINE - 2-3 VIEW

[dg lumbar spine 2-3 views (1 of 3)]
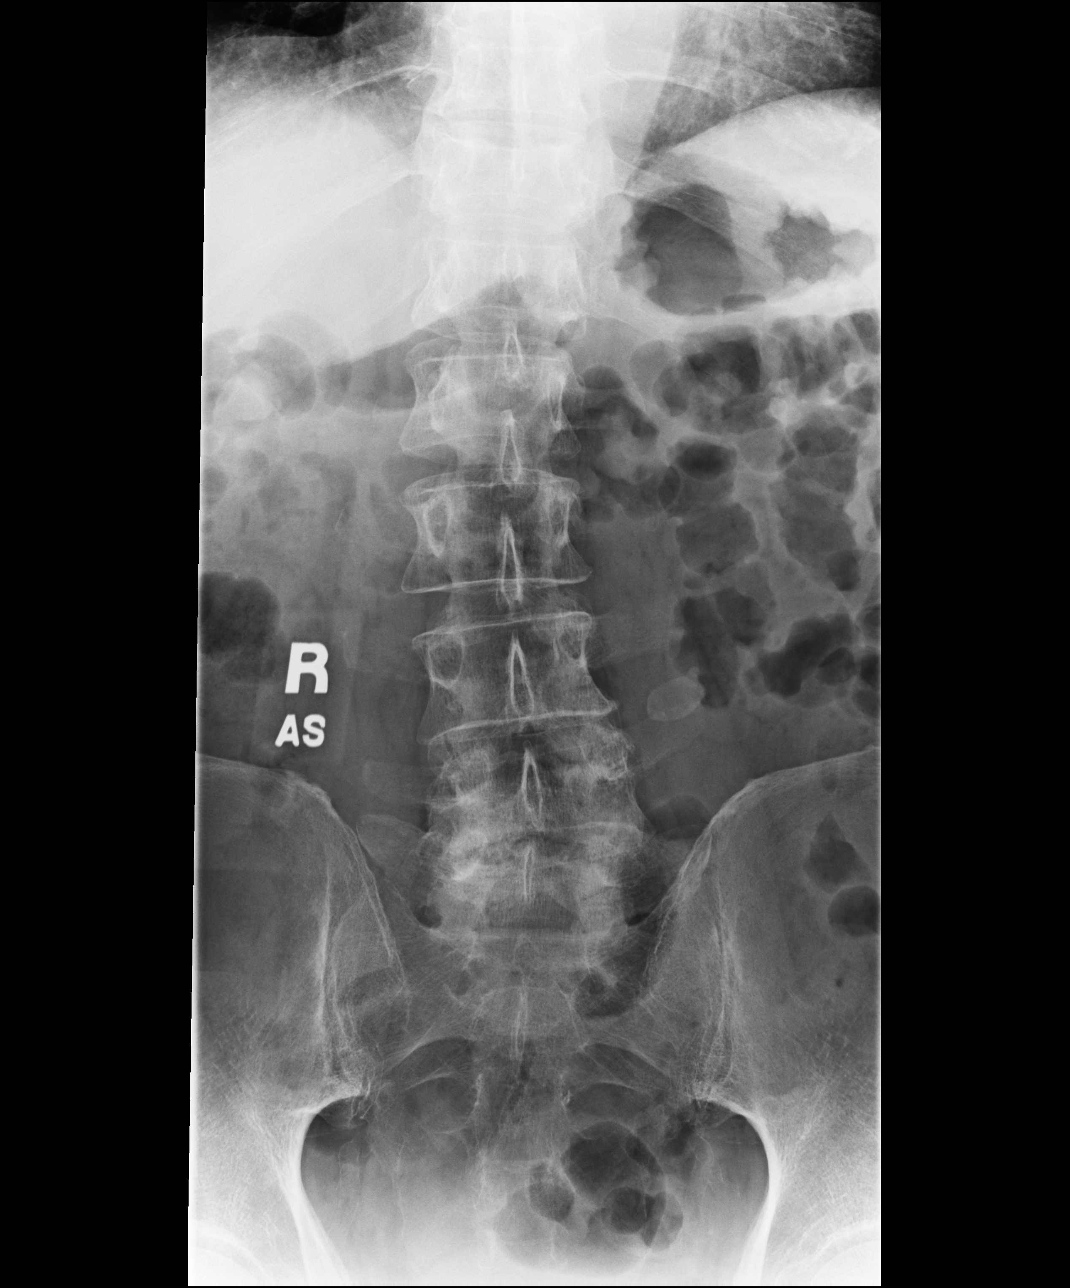

[dg lumbar spine 2-3 views (2 of 3)]
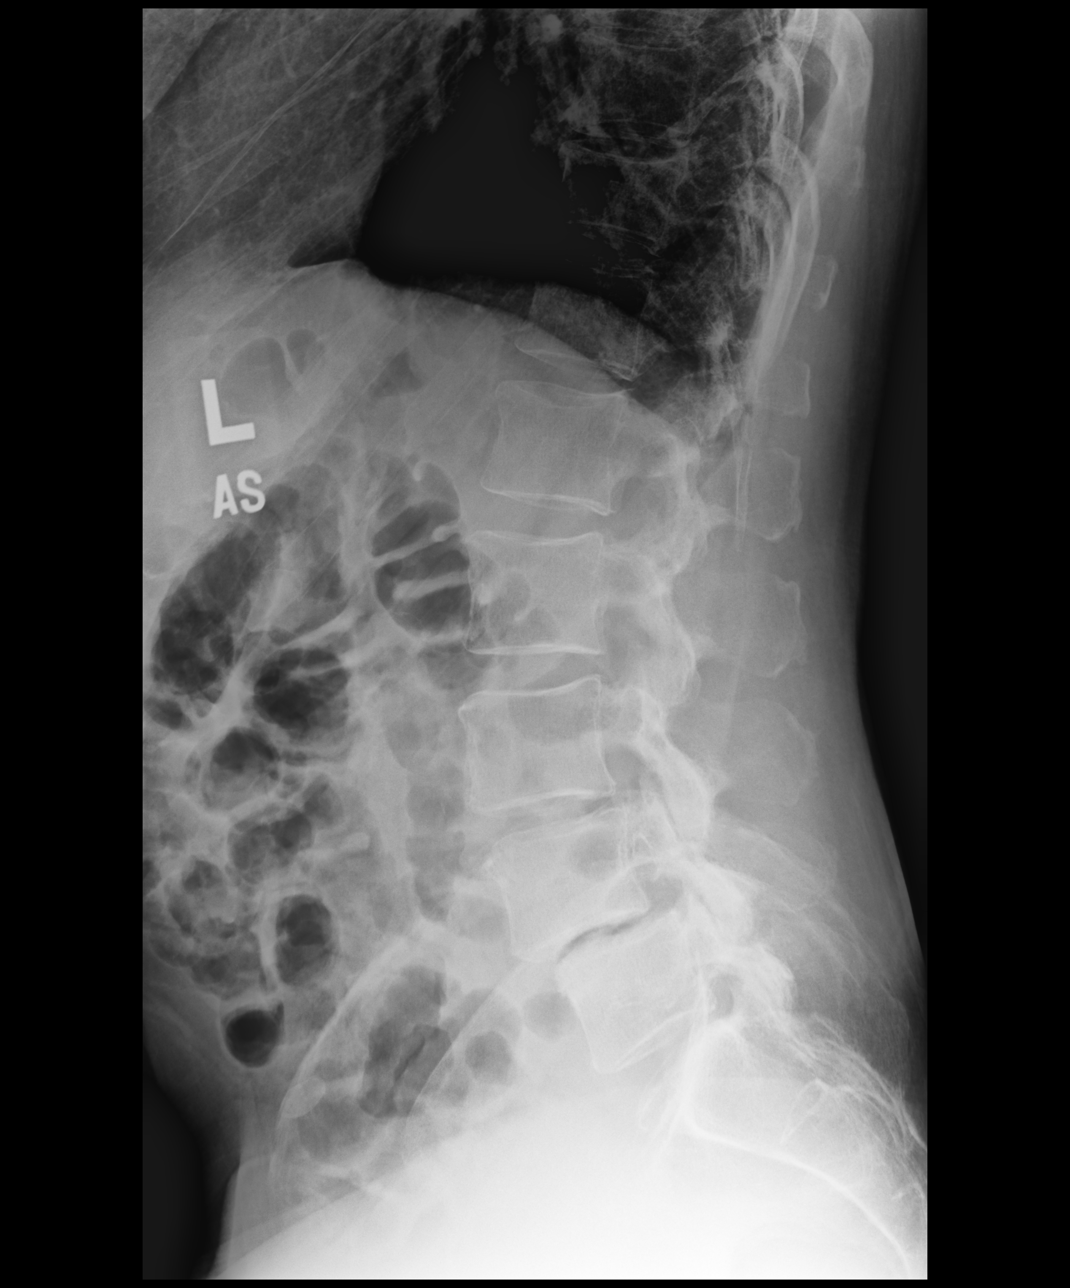

[dg lumbar spine 2-3 views (3 of 3)]
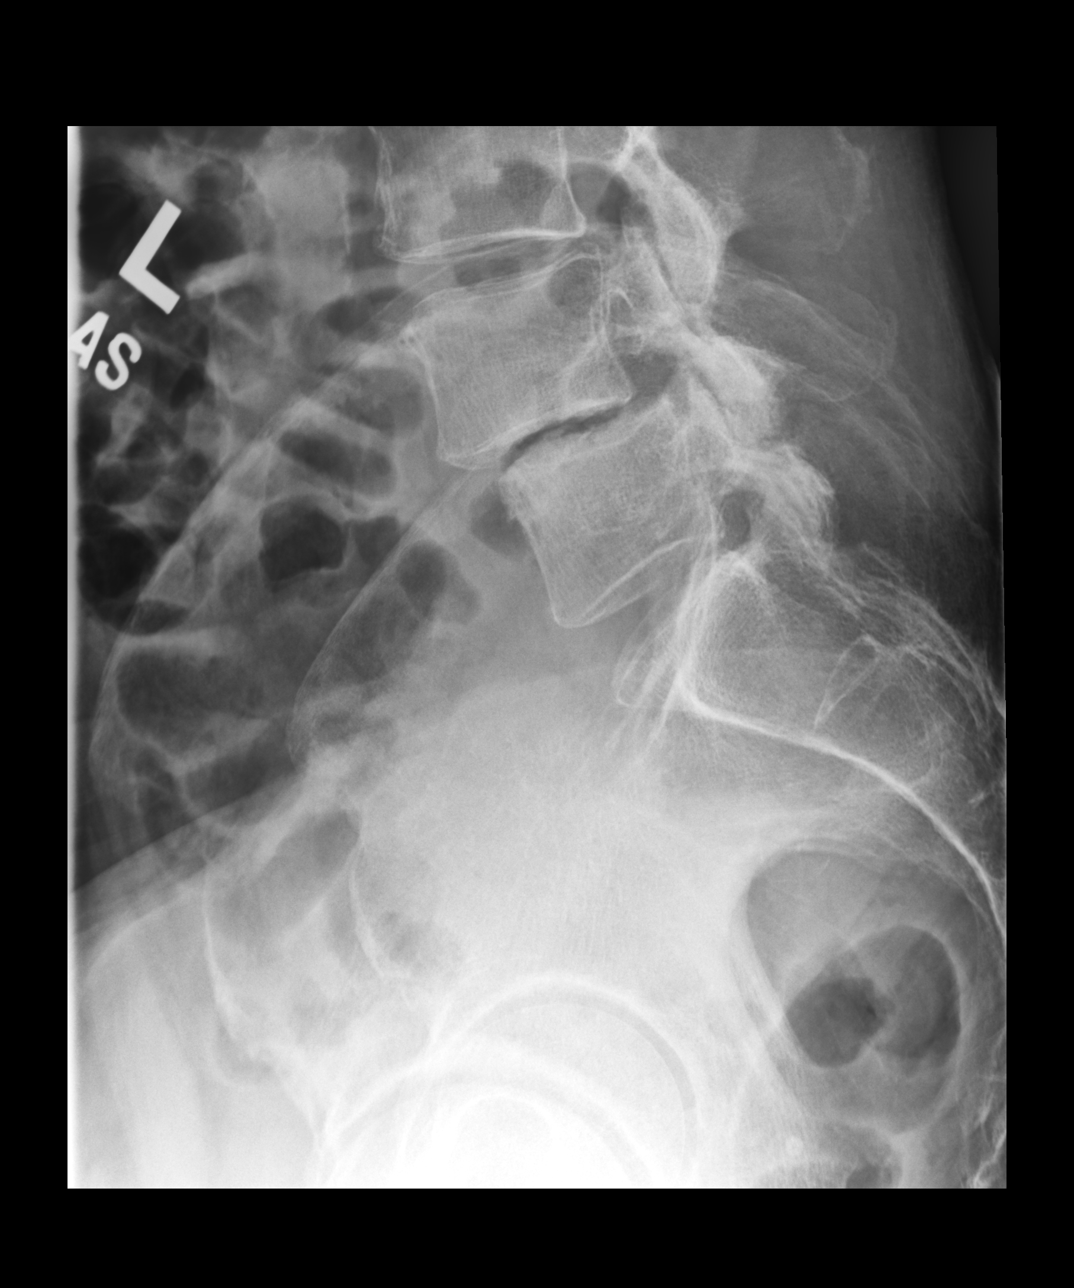

[3 of 3 positions shown; findings below may reference images not displayed]

FINDINGS: There is slight anterolisthesis of L4 on L5 by 7 mm, not seen on the
prior CT of the abdomen from 4027. There has been progression of
degenerative disc disease also noted at L4-5, these changes may well
be due to degenerative change of the facet joints of L4-5 and L5-S1.
Otherwise intervertebral disc spaces appear normal. No compression
deformity is seen. The SI joints are corticated. The bowel gas
pattern is nonspecific. There is an oval calcification to the left
of L3-4. In review of prior CT scans this appears to represent a
calcified mesenteric node which is mobile within the peritoneal
cavity.
IMPRESSION: 1. 7 mm anterolisthesis of L4 on L5 which is new compared to the
sagittal lumbar spine images from 08/27/2012 CT of the abdomen
pelvis.
2. Degenerative disc disease at L4-5 which has progressed in the
interval.

## 2018-07-30 DIAGNOSIS — L570 Actinic keratosis: Secondary | ICD-10-CM | POA: Diagnosis not present

## 2018-08-21 DIAGNOSIS — L821 Other seborrheic keratosis: Secondary | ICD-10-CM | POA: Diagnosis not present

## 2018-08-21 DIAGNOSIS — D1801 Hemangioma of skin and subcutaneous tissue: Secondary | ICD-10-CM | POA: Diagnosis not present

## 2018-08-21 DIAGNOSIS — L570 Actinic keratosis: Secondary | ICD-10-CM | POA: Diagnosis not present

## 2018-08-21 DIAGNOSIS — L814 Other melanin hyperpigmentation: Secondary | ICD-10-CM | POA: Diagnosis not present

## 2018-08-21 DIAGNOSIS — D225 Melanocytic nevi of trunk: Secondary | ICD-10-CM | POA: Diagnosis not present

## 2018-08-21 DIAGNOSIS — Z85828 Personal history of other malignant neoplasm of skin: Secondary | ICD-10-CM | POA: Diagnosis not present

## 2018-08-21 DIAGNOSIS — L820 Inflamed seborrheic keratosis: Secondary | ICD-10-CM | POA: Diagnosis not present

## 2019-01-02 DIAGNOSIS — C61 Malignant neoplasm of prostate: Secondary | ICD-10-CM | POA: Diagnosis not present

## 2019-01-02 DIAGNOSIS — R351 Nocturia: Secondary | ICD-10-CM | POA: Diagnosis not present

## 2019-01-02 DIAGNOSIS — N5201 Erectile dysfunction due to arterial insufficiency: Secondary | ICD-10-CM | POA: Diagnosis not present

## 2019-01-02 DIAGNOSIS — N401 Enlarged prostate with lower urinary tract symptoms: Secondary | ICD-10-CM | POA: Diagnosis not present

## 2019-02-06 DIAGNOSIS — L821 Other seborrheic keratosis: Secondary | ICD-10-CM | POA: Diagnosis not present

## 2019-02-06 DIAGNOSIS — Z23 Encounter for immunization: Secondary | ICD-10-CM | POA: Diagnosis not present

## 2019-02-06 DIAGNOSIS — L309 Dermatitis, unspecified: Secondary | ICD-10-CM | POA: Diagnosis not present

## 2019-02-28 MED FILL — valACYclovir HCL 1 GM TABS: 1 | 1 days supply | Qty: 4 | Fill #0

## 2019-03-27 DIAGNOSIS — L570 Actinic keratosis: Secondary | ICD-10-CM | POA: Diagnosis not present

## 2019-03-27 DIAGNOSIS — Z85828 Personal history of other malignant neoplasm of skin: Secondary | ICD-10-CM | POA: Diagnosis not present

## 2019-03-27 DIAGNOSIS — L820 Inflamed seborrheic keratosis: Secondary | ICD-10-CM | POA: Diagnosis not present

## 2019-03-27 DIAGNOSIS — L309 Dermatitis, unspecified: Secondary | ICD-10-CM | POA: Diagnosis not present

## 2019-03-27 DIAGNOSIS — L821 Other seborrheic keratosis: Secondary | ICD-10-CM | POA: Diagnosis not present

## 2019-05-03 ENCOUNTER — Other Ambulatory Visit: Payer: Self-pay | Admitting: Internal Medicine

## 2019-05-03 ENCOUNTER — Ambulatory Visit
Admission: RE | Admit: 2019-05-03 | Discharge: 2019-05-03 | Disposition: A | Discharge status: Home / Self Care | Payer: PPO | Source: Ambulatory Visit | Attending: Internal Medicine | Admitting: Internal Medicine

## 2019-05-03 DIAGNOSIS — Z136 Encounter for screening for cardiovascular disorders: Secondary | ICD-10-CM | POA: Diagnosis not present

## 2019-05-03 DIAGNOSIS — C61 Malignant neoplasm of prostate: Secondary | ICD-10-CM

## 2019-05-03 DIAGNOSIS — R7303 Prediabetes: Secondary | ICD-10-CM | POA: Diagnosis not present

## 2019-05-03 DIAGNOSIS — K219 Gastro-esophageal reflux disease without esophagitis: Secondary | ICD-10-CM | POA: Diagnosis not present

## 2019-05-03 DIAGNOSIS — M179 Osteoarthritis of knee, unspecified: Secondary | ICD-10-CM | POA: Diagnosis not present

## 2019-05-03 DIAGNOSIS — G4733 Obstructive sleep apnea (adult) (pediatric): Secondary | ICD-10-CM | POA: Diagnosis not present

## 2019-05-03 DIAGNOSIS — N400 Enlarged prostate without lower urinary tract symptoms: Secondary | ICD-10-CM | POA: Diagnosis not present

## 2019-05-03 DIAGNOSIS — M5136 Other intervertebral disc degeneration, lumbar region: Secondary | ICD-10-CM | POA: Diagnosis not present

## 2019-05-03 DIAGNOSIS — Z0000 Encounter for general adult medical examination without abnormal findings: Secondary | ICD-10-CM | POA: Diagnosis not present

## 2019-05-03 DIAGNOSIS — F322 Major depressive disorder, single episode, severe without psychotic features: Secondary | ICD-10-CM | POA: Diagnosis not present

## 2019-05-08 DIAGNOSIS — M7752 Other enthesopathy of left foot: Secondary | ICD-10-CM | POA: Diagnosis not present

## 2019-05-08 DIAGNOSIS — G5762 Lesion of plantar nerve, left lower limb: Secondary | ICD-10-CM | POA: Diagnosis not present

## 2019-07-16 DIAGNOSIS — D120 Benign neoplasm of cecum: Secondary | ICD-10-CM | POA: Diagnosis not present

## 2019-07-16 DIAGNOSIS — Z800 Family history of malignant neoplasm of digestive organs: Secondary | ICD-10-CM | POA: Diagnosis not present

## 2019-07-16 DIAGNOSIS — K5730 Diverticulosis of large intestine without perforation or abscess without bleeding: Secondary | ICD-10-CM | POA: Diagnosis not present

## 2019-07-16 DIAGNOSIS — D122 Benign neoplasm of ascending colon: Secondary | ICD-10-CM | POA: Diagnosis not present

## 2019-07-16 DIAGNOSIS — K648 Other hemorrhoids: Secondary | ICD-10-CM | POA: Diagnosis not present

## 2019-07-16 DIAGNOSIS — Z86010 Personal history of colonic polyps: Secondary | ICD-10-CM | POA: Diagnosis not present

## 2019-07-19 DIAGNOSIS — D120 Benign neoplasm of cecum: Secondary | ICD-10-CM | POA: Diagnosis not present

## 2019-07-19 DIAGNOSIS — D122 Benign neoplasm of ascending colon: Secondary | ICD-10-CM | POA: Diagnosis not present

## 2019-11-04 DIAGNOSIS — Z23 Encounter for immunization: Secondary | ICD-10-CM | POA: Diagnosis not present

## 2019-11-04 DIAGNOSIS — L814 Other melanin hyperpigmentation: Secondary | ICD-10-CM | POA: Diagnosis not present

## 2019-11-04 DIAGNOSIS — L820 Inflamed seborrheic keratosis: Secondary | ICD-10-CM | POA: Diagnosis not present

## 2019-11-04 DIAGNOSIS — L570 Actinic keratosis: Secondary | ICD-10-CM | POA: Diagnosis not present

## 2019-11-04 DIAGNOSIS — L821 Other seborrheic keratosis: Secondary | ICD-10-CM | POA: Diagnosis not present

## 2019-11-04 DIAGNOSIS — Z85828 Personal history of other malignant neoplasm of skin: Secondary | ICD-10-CM | POA: Diagnosis not present

## 2019-11-04 DIAGNOSIS — D225 Melanocytic nevi of trunk: Secondary | ICD-10-CM | POA: Diagnosis not present

## 2020-01-12 ENCOUNTER — Emergency Department (HOSPITAL_COMMUNITY)
Admission: EM | Admit: 2020-01-12 | Discharge: 2020-01-12 | Disposition: A | Discharge status: Home / Self Care | Payer: PPO | Attending: Emergency Medicine | Admitting: Emergency Medicine

## 2020-01-12 ENCOUNTER — Encounter (HOSPITAL_COMMUNITY): Payer: Self-pay

## 2020-01-12 ENCOUNTER — Other Ambulatory Visit: Payer: Self-pay

## 2020-01-12 DIAGNOSIS — Y929 Unspecified place or not applicable: Secondary | ICD-10-CM | POA: Diagnosis not present

## 2020-01-12 DIAGNOSIS — S61011A Laceration without foreign body of right thumb without damage to nail, initial encounter: Secondary | ICD-10-CM | POA: Diagnosis not present

## 2020-01-12 DIAGNOSIS — T148XXA Other injury of unspecified body region, initial encounter: Secondary | ICD-10-CM

## 2020-01-12 DIAGNOSIS — Z79899 Other long term (current) drug therapy: Secondary | ICD-10-CM | POA: Insufficient documentation

## 2020-01-12 DIAGNOSIS — S61001A Unspecified open wound of right thumb without damage to nail, initial encounter: Secondary | ICD-10-CM | POA: Diagnosis not present

## 2020-01-12 DIAGNOSIS — Y999 Unspecified external cause status: Secondary | ICD-10-CM | POA: Diagnosis not present

## 2020-01-12 DIAGNOSIS — Y93G1 Activity, food preparation and clean up: Secondary | ICD-10-CM | POA: Insufficient documentation

## 2020-01-12 DIAGNOSIS — W274XXA Contact with kitchen utensil, initial encounter: Secondary | ICD-10-CM | POA: Insufficient documentation

## 2020-01-12 MED ORDER — LIDOCAINE HCL (PF) 1 % IJ SOLN: 5.0000 mL | Freq: Once | INTRAMUSCULAR | Status: AC

## 2020-01-12 MED ORDER — CEPHALEXIN 500 MG PO CAPS: 500.0000 mg | ORAL_CAPSULE | Freq: Two times a day (BID) | ORAL | 0 refills | Status: AC

## 2020-01-12 MED ORDER — LIDOCAINE HCL (PF) 1 % IJ SOLN
INTRAMUSCULAR | Status: AC
  Administered 2020-01-12: 5 mL
  Filled 2020-01-12: qty 5

## 2020-01-12 NOTE — ED Notes (Signed)
Patient verbalizes understanding of discharge instructions and prescription medications. Opportunity for questioning and answers were provided. All questions answered completely. Armband removed by staff, pt discharged from ED. Ambulatory from ED with strong, steady gait

## 2020-01-12 NOTE — ED Notes (Signed)
EDP at bedside  

## 2020-01-12 NOTE — ED Provider Notes (Signed)
[facility] EMERGENCY DEPARTMENT Provider Note   CSN: [id number] Arrival date & time: 01/12/20  [id number]     History Chief Complaint  Patient presents with  . Laceration    [name] is a 74 y.o. male who presents to the ED today with laceration to R thumb from a mandolin slicer that occurred earlier tonight.  he reports he was cutting potatoes with a mandolin when he accidentally sliced his thumb.  Patient has had excessive bleeding in the ED blood pressure dressing is applied at this time.  He is not on anticoagulants.  His tetanus is up-to-date.  He complains of pain to the thumb but otherwise has no other complaints.  Nuys weakness, numbness, tingling, fevers, chills, any other associated symptoms.  The history is provided by the patient.       Past Medical History:  Diagnosis Date  . Abdominal pain   . Arthritis   . Cancer ([place])    PROSTATE CANCER 3 YRS AGO  . Hernia     There are no problems to display for this patient.   Past Surgical History:  Procedure Laterality Date  . HERNIA REPAIR    . INGUINAL HERNIA REPAIR  10/07/2011   Procedure: HERNIA REPAIR INGUINAL ADULT;  Surgeon: Odis Hollingshead, MD;  Location: [place];  Service: General;  Laterality: Left;  Left Inguinal Repair with Mesh  . INSERTION PROSTATE RADIATION SEED  December 2010   seed implants   . KNEE CARTILAGE SURGERY  6/12   arthritis and cartilage removal        Family History  Problem Relation Age of Onset  . Cancer Father        colon    Social History   Tobacco Use  . Smoking status: Never Smoker  . Smokeless tobacco: Never Used  Substance Use Topics  . Alcohol use: No  . Drug use: No    Home Medications Prior to Admission medications   Medication Sig Start Date End Date Taking? Authorizing Provider  Calcium Carbonate-Vitamin D (CALTRATE 600+D) 600-400 MG-UNIT per tablet Take 1 tablet by mouth daily.      [provider]  cephALEXin (KEFLEX) 500 MG  capsule Take 1 capsule (500 mg total) by mouth 2 (two) times daily for 7 days. 01/12/20 01/19/20  Eustaquio Maize, PA-C  Multiple Vitamins-Minerals (CENTRUM SILVER ULTRA MENS PO) Take 1 capsule by mouth daily.      [provider]  naproxen sodium (ALEVE) 220 MG tablet Take 220 mg by mouth 2 (two) times daily with a meal.     [provider]  Omega-3 Fatty Acids (FISH OIL) 1200 MG CAPS Take 1 capsule by mouth daily.      [provider]  Tamsulosin HCl (FLOMAX) 0.4 MG CAPS Take 0.4 mg by mouth 2 (two) times daily.      [provider]    Allergies    Patient has no known allergies.  Review of Systems   Review of Systems  Constitutional: Negative for chills and fever.  Musculoskeletal: Positive for arthralgias.  Skin: Positive for wound.  Neurological: Negative for weakness and numbness.    Physical Exam Updated Vital Signs BP 129/82   Pulse 82   Temp 98.8 F (37.1 C) (Oral)   Resp 18   SpO2 96%   Physical Exam Vitals and nursing note reviewed.  Constitutional:      Appearance: He is not ill-appearing.  HENT:     Head: Normocephalic  and atraumatic.  Eyes:     Conjunctiva/sclera: Conjunctivae normal.  Cardiovascular:     Rate and Rhythm: Normal rate and regular rhythm.     Pulses: Normal pulses.  Pulmonary:     Effort: Pulmonary effort is normal.     Breath sounds: Normal breath sounds. No wheezing, rhonchi or rales.  Musculoskeletal:     Comments: ROM intact to MCP and DIP joint of thumb. 2+ radial pulse.   Skin:    General: Skin is warm and dry.     Coloration: Skin is not jaundiced.     Comments: Large skin avulsion measuring approximately 2 x 1 cm noted to lateral aspect of R thumb; multiple bleeding capillaries present  Neurological:     Mental Status: He is alert.     ED Results / Procedures / Treatments   Labs (all labs ordered are listed, but only abnormal results are displayed) Labs Reviewed - No data to  display  EKG None  Radiology No results found.  Procedures .Marland KitchenLaceration Repair  Date/Time: 01/12/2020 10:07 PM Performed by: Eustaquio Maize, PA-C Authorized by: Eustaquio Maize, PA-C   Consent:    Consent obtained:  Verbal   Consent given by:  Patient   Risks discussed:  Pain, poor cosmetic result and infection Anesthesia (see MAR for exact dosages):    Anesthesia method:  Nerve block   Block location:  R 1st digit   Block needle gauge:  25 G   Block anesthetic:  Lidocaine 1% w/o epi   Block injection procedure:  Anatomic landmarks identified   Block outcome:  Anesthesia achieved Laceration details:    Location:  Finger   Finger location:  R thumb   Length (cm):  2   Depth (mm):  1 Repair type:    Repair type:  Intermediate Exploration:    Hemostasis achieved with:  Direct pressure Treatment:    Area cleansed with:  Betadine   Irrigation solution:  Sterile saline Skin repair:    Repair method: wound sealant. Post-procedure details:    Dressing:  Non-adherent dressing   Patient tolerance of procedure:  Tolerated well, no immediate complications Comments:     Powdered wound sealant applied to avulsion with small breakthrough capillaries with continued bleeding; silver nitrate cautery performed with successful hemostasis    (including critical care time)  Medications Ordered in ED Medications  lidocaine (PF) (XYLOCAINE) 1 % injection 5 mL (has no administration in time range)  lidocaine (PF) (XYLOCAINE) 1 % injection (has no administration in time range)    ED Course  I have reviewed the triage vital signs and the nursing notes.  Pertinent labs & imaging results that were available during my care of the patient were reviewed by me and considered in my medical decision making (see chart for details).  74 year old male who presents the ED with laceration to right thumb from [facility] slicer.  Patient has large avulsion type injury to the distal aspect with  continued bleeding.  Tetanus Is up-to-date.  Powder wound sealant applied as well as silver nitrate to help cauterize small capillaries that have continued to bleed.  Nonadherent dressing applied.  Patient discharged home with antibiotics to help prevent infection.  Advised to follow-up with his PCP for wound recheck or to return to the ED for wound recheck.   This note was prepared using Dragon voice recognition software and may include unintentional dictation errors due to the inherent limitations of voice recognition software.     MDM Rules/Calculators/A&P  Final Clinical Impression(s) / ED Diagnoses Final diagnoses:  Skin avulsion    Rx / DC Orders ED Discharge Orders         Ordered    cephALEXin (KEFLEX) 500 MG capsule  2 times daily     01/12/20 2157           Discharge Instructions     Please keep your finger dry for the next 24 hours. After that you can shower. Do not submerge your hand in water for the next week. Change your dressing every 24 hours or sooner if you have some bleeding in your bandage. Return to the ED IMMEDIATELY if you are unable to control bleeding at home with direct pressure for 15 minutes.   Pick up antibiotics and take as prescribed for the next week. Follow up with your PCP for wound recheck.        Eustaquio Maize, PA-C 01/12/20 2210    Elnora Morrison, MD 01/12/20 [phone]

## 2020-01-12 NOTE — Discharge Instructions (Signed)
Please keep your finger dry for the next 24 hours. After that you can shower. Do not submerge your hand in water for the next week. Change your dressing every 24 hours or sooner if you have some bleeding in your bandage. Return to the ED IMMEDIATELY if you are unable to control bleeding at home with direct pressure for 15 minutes.   Pick up antibiotics and take as prescribed for the next week. Follow up with your PCP for wound recheck.

## 2020-01-12 NOTE — ED Notes (Signed)
Wound irrigation completed. Active oozing noted at site. Site dressed with non-adherent gauze. Pressure applied. Bleeding controlled. Movement and sensation intact

## 2020-01-12 NOTE — ED Triage Notes (Addendum)
Pt ambulatory to ED Rm 4 from [employer]. Pt is A&Ox4, in NAD. Pt speaking in full sentences. VSS. Pt reports he was slicing potatoes when he sliced the top of his R thumb. Thumb dressed with gauze. UTD on tetanus

## 2020-02-24 DIAGNOSIS — N5201 Erectile dysfunction due to arterial insufficiency: Secondary | ICD-10-CM | POA: Diagnosis not present

## 2020-02-24 DIAGNOSIS — R351 Nocturia: Secondary | ICD-10-CM | POA: Diagnosis not present

## 2020-02-24 DIAGNOSIS — N401 Enlarged prostate with lower urinary tract symptoms: Secondary | ICD-10-CM | POA: Diagnosis not present

## 2020-03-11 DIAGNOSIS — C61 Malignant neoplasm of prostate: Secondary | ICD-10-CM | POA: Diagnosis not present

## 2020-05-04 DIAGNOSIS — K4090 Unilateral inguinal hernia, without obstruction or gangrene, not specified as recurrent: Secondary | ICD-10-CM | POA: Diagnosis not present

## 2020-05-04 DIAGNOSIS — D225 Melanocytic nevi of trunk: Secondary | ICD-10-CM | POA: Diagnosis not present

## 2020-05-04 DIAGNOSIS — L570 Actinic keratosis: Secondary | ICD-10-CM | POA: Diagnosis not present

## 2020-05-04 DIAGNOSIS — F322 Major depressive disorder, single episode, severe without psychotic features: Secondary | ICD-10-CM | POA: Diagnosis not present

## 2020-05-04 DIAGNOSIS — B078 Other viral warts: Secondary | ICD-10-CM | POA: Diagnosis not present

## 2020-05-04 DIAGNOSIS — R7309 Other abnormal glucose: Secondary | ICD-10-CM | POA: Diagnosis not present

## 2020-05-04 DIAGNOSIS — L821 Other seborrheic keratosis: Secondary | ICD-10-CM | POA: Diagnosis not present

## 2020-05-04 DIAGNOSIS — Z0000 Encounter for general adult medical examination without abnormal findings: Secondary | ICD-10-CM | POA: Diagnosis not present

## 2020-05-04 DIAGNOSIS — N400 Enlarged prostate without lower urinary tract symptoms: Secondary | ICD-10-CM | POA: Diagnosis not present

## 2020-05-04 DIAGNOSIS — K219 Gastro-esophageal reflux disease without esophagitis: Secondary | ICD-10-CM | POA: Diagnosis not present

## 2020-05-04 DIAGNOSIS — G4733 Obstructive sleep apnea (adult) (pediatric): Secondary | ICD-10-CM | POA: Diagnosis not present

## 2020-05-04 DIAGNOSIS — Z1389 Encounter for screening for other disorder: Secondary | ICD-10-CM | POA: Diagnosis not present

## 2020-05-04 DIAGNOSIS — Z8546 Personal history of malignant neoplasm of prostate: Secondary | ICD-10-CM | POA: Diagnosis not present

## 2020-10-05 DIAGNOSIS — L720 Epidermal cyst: Secondary | ICD-10-CM | POA: Diagnosis not present

## 2020-10-05 DIAGNOSIS — D485 Neoplasm of uncertain behavior of skin: Secondary | ICD-10-CM | POA: Diagnosis not present

## 2020-10-05 DIAGNOSIS — D044 Carcinoma in situ of skin of scalp and neck: Secondary | ICD-10-CM | POA: Diagnosis not present

## 2020-10-07 ENCOUNTER — Other Ambulatory Visit (HOSPITAL_COMMUNITY): Payer: Self-pay | Admitting: Dermatology

## 2020-11-04 DIAGNOSIS — D044 Carcinoma in situ of skin of scalp and neck: Secondary | ICD-10-CM | POA: Diagnosis not present

## 2020-11-04 DIAGNOSIS — L821 Other seborrheic keratosis: Secondary | ICD-10-CM | POA: Diagnosis not present

## 2020-11-04 DIAGNOSIS — L570 Actinic keratosis: Secondary | ICD-10-CM | POA: Diagnosis not present

## 2020-11-04 DIAGNOSIS — L820 Inflamed seborrheic keratosis: Secondary | ICD-10-CM | POA: Diagnosis not present

## 2020-11-04 DIAGNOSIS — D225 Melanocytic nevi of trunk: Secondary | ICD-10-CM | POA: Diagnosis not present

## 2020-11-04 DIAGNOSIS — L814 Other melanin hyperpigmentation: Secondary | ICD-10-CM | POA: Diagnosis not present

## 2020-11-04 DIAGNOSIS — Z85828 Personal history of other malignant neoplasm of skin: Secondary | ICD-10-CM | POA: Diagnosis not present

## 2021-01-07 ENCOUNTER — Other Ambulatory Visit (HOSPITAL_COMMUNITY): Payer: Self-pay | Admitting: Internal Medicine

## 2021-01-07 DIAGNOSIS — M48062 Spinal stenosis, lumbar region with neurogenic claudication: Secondary | ICD-10-CM | POA: Diagnosis not present

## 2021-01-07 DIAGNOSIS — M5136 Other intervertebral disc degeneration, lumbar region: Secondary | ICD-10-CM | POA: Diagnosis not present

## 2021-01-07 DIAGNOSIS — M179 Osteoarthritis of knee, unspecified: Secondary | ICD-10-CM | POA: Diagnosis not present

## 2021-01-07 DIAGNOSIS — R059 Cough, unspecified: Secondary | ICD-10-CM | POA: Diagnosis not present

## 2021-01-18 DIAGNOSIS — M4807 Spinal stenosis, lumbosacral region: Secondary | ICD-10-CM | POA: Diagnosis not present

## 2021-01-21 DIAGNOSIS — H2513 Age-related nuclear cataract, bilateral: Secondary | ICD-10-CM | POA: Diagnosis not present

## 2021-01-21 DIAGNOSIS — Z5189 Encounter for other specified aftercare: Secondary | ICD-10-CM | POA: Diagnosis not present

## 2021-01-21 DIAGNOSIS — H524 Presbyopia: Secondary | ICD-10-CM | POA: Diagnosis not present

## 2021-01-21 DIAGNOSIS — H5203 Hypermetropia, bilateral: Secondary | ICD-10-CM | POA: Diagnosis not present

## 2021-01-21 DIAGNOSIS — D044 Carcinoma in situ of skin of scalp and neck: Secondary | ICD-10-CM | POA: Diagnosis not present

## 2021-01-21 DIAGNOSIS — H40013 Open angle with borderline findings, low risk, bilateral: Secondary | ICD-10-CM | POA: Diagnosis not present

## 2021-01-25 DIAGNOSIS — M545 Low back pain, unspecified: Secondary | ICD-10-CM | POA: Diagnosis not present

## 2021-02-01 DIAGNOSIS — M5416 Radiculopathy, lumbar region: Secondary | ICD-10-CM | POA: Diagnosis not present

## 2021-02-04 ENCOUNTER — Other Ambulatory Visit: Payer: Self-pay | Admitting: Orthopedic Surgery

## 2021-02-12 DIAGNOSIS — M48062 Spinal stenosis, lumbar region with neurogenic claudication: Secondary | ICD-10-CM | POA: Diagnosis not present

## 2021-02-22 DIAGNOSIS — L570 Actinic keratosis: Secondary | ICD-10-CM | POA: Diagnosis not present

## 2021-02-22 DIAGNOSIS — D485 Neoplasm of uncertain behavior of skin: Secondary | ICD-10-CM | POA: Diagnosis not present

## 2021-02-22 DIAGNOSIS — C4492 Squamous cell carcinoma of skin, unspecified: Secondary | ICD-10-CM | POA: Diagnosis not present

## 2021-02-24 ENCOUNTER — Inpatient Hospital Stay: Admit: 2021-02-24 | Payer: PPO | Admitting: Orthopedic Surgery

## 2021-02-24 DIAGNOSIS — C61 Malignant neoplasm of prostate: Secondary | ICD-10-CM | POA: Diagnosis not present

## 2021-02-24 DIAGNOSIS — Z8546 Personal history of malignant neoplasm of prostate: Secondary | ICD-10-CM | POA: Diagnosis not present

## 2021-02-24 DIAGNOSIS — N5201 Erectile dysfunction due to arterial insufficiency: Secondary | ICD-10-CM | POA: Diagnosis not present

## 2021-02-24 DIAGNOSIS — R351 Nocturia: Secondary | ICD-10-CM | POA: Diagnosis not present

## 2021-02-24 DIAGNOSIS — N401 Enlarged prostate with lower urinary tract symptoms: Secondary | ICD-10-CM | POA: Diagnosis not present

## 2021-02-24 SURGERY — TRANSFORAMINAL LUMBAR INTERBODY FUSION (TLIF) WITH PEDICLE SCREW FIXATION 2 LEVEL
Anesthesia: General | Laterality: Left

## 2021-03-01 DIAGNOSIS — M48062 Spinal stenosis, lumbar region with neurogenic claudication: Secondary | ICD-10-CM | POA: Diagnosis not present

## 2021-03-01 DIAGNOSIS — R262 Difficulty in walking, not elsewhere classified: Secondary | ICD-10-CM | POA: Diagnosis not present

## 2021-03-08 DIAGNOSIS — C4442 Squamous cell carcinoma of skin of scalp and neck: Secondary | ICD-10-CM | POA: Diagnosis not present

## 2021-03-12 DIAGNOSIS — R262 Difficulty in walking, not elsewhere classified: Secondary | ICD-10-CM | POA: Diagnosis not present

## 2021-03-12 DIAGNOSIS — M48062 Spinal stenosis, lumbar region with neurogenic claudication: Secondary | ICD-10-CM | POA: Diagnosis not present

## 2021-03-16 DIAGNOSIS — R262 Difficulty in walking, not elsewhere classified: Secondary | ICD-10-CM | POA: Diagnosis not present

## 2021-03-16 DIAGNOSIS — M48062 Spinal stenosis, lumbar region with neurogenic claudication: Secondary | ICD-10-CM | POA: Diagnosis not present

## 2021-03-18 DIAGNOSIS — M48062 Spinal stenosis, lumbar region with neurogenic claudication: Secondary | ICD-10-CM | POA: Diagnosis not present

## 2021-03-18 DIAGNOSIS — R262 Difficulty in walking, not elsewhere classified: Secondary | ICD-10-CM | POA: Diagnosis not present

## 2021-03-23 DIAGNOSIS — R262 Difficulty in walking, not elsewhere classified: Secondary | ICD-10-CM | POA: Diagnosis not present

## 2021-03-23 DIAGNOSIS — M48062 Spinal stenosis, lumbar region with neurogenic claudication: Secondary | ICD-10-CM | POA: Diagnosis not present

## 2021-03-26 DIAGNOSIS — M48062 Spinal stenosis, lumbar region with neurogenic claudication: Secondary | ICD-10-CM | POA: Diagnosis not present

## 2021-03-26 DIAGNOSIS — R262 Difficulty in walking, not elsewhere classified: Secondary | ICD-10-CM | POA: Diagnosis not present

## 2021-04-07 DIAGNOSIS — R262 Difficulty in walking, not elsewhere classified: Secondary | ICD-10-CM | POA: Diagnosis not present

## 2021-04-07 DIAGNOSIS — M48062 Spinal stenosis, lumbar region with neurogenic claudication: Secondary | ICD-10-CM | POA: Diagnosis not present

## 2021-04-08 DIAGNOSIS — M48062 Spinal stenosis, lumbar region with neurogenic claudication: Secondary | ICD-10-CM | POA: Diagnosis not present

## 2021-05-03 DIAGNOSIS — M48062 Spinal stenosis, lumbar region with neurogenic claudication: Secondary | ICD-10-CM | POA: Diagnosis not present

## 2021-05-14 ENCOUNTER — Ambulatory Visit (HOSPITAL_COMMUNITY)
Admission: EM | Admit: 2021-05-14 | Discharge: 2021-05-14 | Disposition: A | Discharge status: Home / Self Care | Payer: PPO

## 2021-05-26 ENCOUNTER — Ambulatory Visit
Admission: RE | Admit: 2021-05-26 | Discharge: 2021-05-26 | Disposition: A | Discharge status: Home / Self Care | Payer: PPO | Source: Ambulatory Visit | Attending: Internal Medicine | Admitting: Internal Medicine

## 2021-05-26 ENCOUNTER — Other Ambulatory Visit: Payer: Self-pay | Admitting: Internal Medicine

## 2021-05-26 ENCOUNTER — Other Ambulatory Visit: Payer: Self-pay

## 2021-05-26 DIAGNOSIS — R059 Cough, unspecified: Secondary | ICD-10-CM

## 2021-05-26 DIAGNOSIS — M48062 Spinal stenosis, lumbar region with neurogenic claudication: Secondary | ICD-10-CM | POA: Diagnosis not present

## 2021-05-26 DIAGNOSIS — Z8546 Personal history of malignant neoplasm of prostate: Secondary | ICD-10-CM | POA: Diagnosis not present

## 2021-05-26 DIAGNOSIS — F331 Major depressive disorder, recurrent, moderate: Secondary | ICD-10-CM | POA: Diagnosis not present

## 2021-05-26 DIAGNOSIS — N400 Enlarged prostate without lower urinary tract symptoms: Secondary | ICD-10-CM | POA: Diagnosis not present

## 2021-05-26 DIAGNOSIS — K219 Gastro-esophageal reflux disease without esophagitis: Secondary | ICD-10-CM | POA: Diagnosis not present

## 2021-05-26 DIAGNOSIS — R7303 Prediabetes: Secondary | ICD-10-CM | POA: Diagnosis not present

## 2021-05-26 DIAGNOSIS — Z0000 Encounter for general adult medical examination without abnormal findings: Secondary | ICD-10-CM | POA: Diagnosis not present

## 2021-05-26 DIAGNOSIS — Z1389 Encounter for screening for other disorder: Secondary | ICD-10-CM | POA: Diagnosis not present

## 2021-05-26 DIAGNOSIS — M5136 Other intervertebral disc degeneration, lumbar region: Secondary | ICD-10-CM | POA: Diagnosis not present

## 2021-06-02 DIAGNOSIS — L820 Inflamed seborrheic keratosis: Secondary | ICD-10-CM | POA: Diagnosis not present

## 2021-06-02 DIAGNOSIS — T8189XA Other complications of procedures, not elsewhere classified, initial encounter: Secondary | ICD-10-CM | POA: Diagnosis not present

## 2021-06-02 DIAGNOSIS — L570 Actinic keratosis: Secondary | ICD-10-CM | POA: Diagnosis not present

## 2021-08-02 DIAGNOSIS — L570 Actinic keratosis: Secondary | ICD-10-CM | POA: Diagnosis not present

## 2021-11-16 DIAGNOSIS — N400 Enlarged prostate without lower urinary tract symptoms: Secondary | ICD-10-CM | POA: Diagnosis not present

## 2021-11-16 DIAGNOSIS — Z8546 Personal history of malignant neoplasm of prostate: Secondary | ICD-10-CM | POA: Diagnosis not present

## 2021-11-16 DIAGNOSIS — F331 Major depressive disorder, recurrent, moderate: Secondary | ICD-10-CM | POA: Diagnosis not present

## 2021-11-16 DIAGNOSIS — C61 Malignant neoplasm of prostate: Secondary | ICD-10-CM | POA: Diagnosis not present

## 2021-11-16 DIAGNOSIS — M179 Osteoarthritis of knee, unspecified: Secondary | ICD-10-CM | POA: Diagnosis not present

## 2021-11-16 DIAGNOSIS — F322 Major depressive disorder, single episode, severe without psychotic features: Secondary | ICD-10-CM | POA: Diagnosis not present

## 2021-11-16 DIAGNOSIS — K219 Gastro-esophageal reflux disease without esophagitis: Secondary | ICD-10-CM | POA: Diagnosis not present

## 2022-03-08 DIAGNOSIS — D485 Neoplasm of uncertain behavior of skin: Secondary | ICD-10-CM | POA: Diagnosis not present

## 2022-03-08 DIAGNOSIS — D044 Carcinoma in situ of skin of scalp and neck: Secondary | ICD-10-CM | POA: Diagnosis not present

## 2022-03-08 DIAGNOSIS — L821 Other seborrheic keratosis: Secondary | ICD-10-CM | POA: Diagnosis not present

## 2022-03-08 DIAGNOSIS — L570 Actinic keratosis: Secondary | ICD-10-CM | POA: Diagnosis not present

## 2022-03-08 DIAGNOSIS — L820 Inflamed seborrheic keratosis: Secondary | ICD-10-CM | POA: Diagnosis not present

## 2022-03-16 DIAGNOSIS — C61 Malignant neoplasm of prostate: Secondary | ICD-10-CM | POA: Diagnosis not present

## 2022-03-16 DIAGNOSIS — N401 Enlarged prostate with lower urinary tract symptoms: Secondary | ICD-10-CM | POA: Diagnosis not present

## 2022-03-16 DIAGNOSIS — R3912 Poor urinary stream: Secondary | ICD-10-CM | POA: Diagnosis not present

## 2022-03-16 DIAGNOSIS — Z8546 Personal history of malignant neoplasm of prostate: Secondary | ICD-10-CM | POA: Diagnosis not present

## 2022-03-28 DIAGNOSIS — D485 Neoplasm of uncertain behavior of skin: Secondary | ICD-10-CM | POA: Diagnosis not present

## 2022-03-28 DIAGNOSIS — L570 Actinic keratosis: Secondary | ICD-10-CM | POA: Diagnosis not present

## 2022-03-28 DIAGNOSIS — Z85828 Personal history of other malignant neoplasm of skin: Secondary | ICD-10-CM | POA: Diagnosis not present

## 2022-03-28 DIAGNOSIS — L821 Other seborrheic keratosis: Secondary | ICD-10-CM | POA: Diagnosis not present

## 2022-03-28 DIAGNOSIS — L814 Other melanin hyperpigmentation: Secondary | ICD-10-CM | POA: Diagnosis not present

## 2022-03-28 DIAGNOSIS — L578 Other skin changes due to chronic exposure to nonionizing radiation: Secondary | ICD-10-CM | POA: Diagnosis not present

## 2022-03-28 DIAGNOSIS — L820 Inflamed seborrheic keratosis: Secondary | ICD-10-CM | POA: Diagnosis not present

## 2022-03-28 DIAGNOSIS — D225 Melanocytic nevi of trunk: Secondary | ICD-10-CM | POA: Diagnosis not present

## 2022-03-28 DIAGNOSIS — C44612 Basal cell carcinoma of skin of right upper limb, including shoulder: Secondary | ICD-10-CM | POA: Diagnosis not present

## 2022-04-19 DIAGNOSIS — Z20822 Contact with and (suspected) exposure to covid-19: Secondary | ICD-10-CM | POA: Diagnosis not present

## 2022-04-19 DIAGNOSIS — C44612 Basal cell carcinoma of skin of right upper limb, including shoulder: Secondary | ICD-10-CM | POA: Diagnosis not present

## 2022-04-25 ENCOUNTER — Ambulatory Visit
Admission: RE | Admit: 2022-04-25 | Discharge: 2022-04-25 | Disposition: A | Discharge status: Home / Self Care | Payer: PPO | Source: Ambulatory Visit | Attending: Internal Medicine | Admitting: Internal Medicine

## 2022-04-25 ENCOUNTER — Other Ambulatory Visit: Payer: Self-pay | Admitting: Internal Medicine

## 2022-04-25 DIAGNOSIS — R051 Acute cough: Secondary | ICD-10-CM | POA: Diagnosis not present

## 2022-04-25 DIAGNOSIS — J189 Pneumonia, unspecified organism: Secondary | ICD-10-CM

## 2022-04-25 DIAGNOSIS — Z8701 Personal history of pneumonia (recurrent): Secondary | ICD-10-CM | POA: Diagnosis not present

## 2022-05-05 DIAGNOSIS — D044 Carcinoma in situ of skin of scalp and neck: Secondary | ICD-10-CM | POA: Diagnosis not present

## 2022-05-31 DIAGNOSIS — F331 Major depressive disorder, recurrent, moderate: Secondary | ICD-10-CM | POA: Diagnosis not present

## 2022-05-31 DIAGNOSIS — Z8546 Personal history of malignant neoplasm of prostate: Secondary | ICD-10-CM | POA: Diagnosis not present

## 2022-05-31 DIAGNOSIS — Z0000 Encounter for general adult medical examination without abnormal findings: Secondary | ICD-10-CM | POA: Diagnosis not present

## 2022-05-31 DIAGNOSIS — R7303 Prediabetes: Secondary | ICD-10-CM | POA: Diagnosis not present

## 2022-05-31 DIAGNOSIS — Z1331 Encounter for screening for depression: Secondary | ICD-10-CM | POA: Diagnosis not present

## 2022-05-31 DIAGNOSIS — K219 Gastro-esophageal reflux disease without esophagitis: Secondary | ICD-10-CM | POA: Diagnosis not present

## 2022-05-31 DIAGNOSIS — M48062 Spinal stenosis, lumbar region with neurogenic claudication: Secondary | ICD-10-CM | POA: Diagnosis not present

## 2022-07-15 DIAGNOSIS — M5416 Radiculopathy, lumbar region: Secondary | ICD-10-CM | POA: Diagnosis not present

## 2022-07-20 ENCOUNTER — Other Ambulatory Visit: Payer: Self-pay | Admitting: Orthopedic Surgery

## 2022-07-26 DIAGNOSIS — C44321 Squamous cell carcinoma of skin of nose: Secondary | ICD-10-CM | POA: Diagnosis not present

## 2022-07-26 DIAGNOSIS — L820 Inflamed seborrheic keratosis: Secondary | ICD-10-CM | POA: Diagnosis not present

## 2022-07-26 DIAGNOSIS — L570 Actinic keratosis: Secondary | ICD-10-CM | POA: Diagnosis not present

## 2022-07-26 DIAGNOSIS — D485 Neoplasm of uncertain behavior of skin: Secondary | ICD-10-CM | POA: Diagnosis not present

## 2022-08-09 DIAGNOSIS — D0439 Carcinoma in situ of skin of other parts of face: Secondary | ICD-10-CM | POA: Diagnosis not present

## 2022-08-16 ENCOUNTER — Other Ambulatory Visit: Payer: Self-pay | Admitting: Orthopedic Surgery

## 2022-08-16 DIAGNOSIS — M545 Low back pain, unspecified: Secondary | ICD-10-CM

## 2022-08-16 DIAGNOSIS — C44321 Squamous cell carcinoma of skin of nose: Secondary | ICD-10-CM | POA: Diagnosis not present

## 2022-08-23 ENCOUNTER — Other Ambulatory Visit (HOSPITAL_COMMUNITY): Payer: PPO

## 2022-08-31 ENCOUNTER — Ambulatory Visit: Admit: 2022-08-31 | Payer: PPO | Admitting: Orthopedic Surgery

## 2022-08-31 SURGERY — TRANSFORAMINAL LUMBAR INTERBODY FUSION (TLIF) WITH PEDICLE SCREW FIXATION 2 LEVEL
Anesthesia: General | Laterality: Left

## 2022-09-13 ENCOUNTER — Other Ambulatory Visit (HOSPITAL_COMMUNITY): Payer: Self-pay

## 2022-09-28 DIAGNOSIS — L578 Other skin changes due to chronic exposure to nonionizing radiation: Secondary | ICD-10-CM | POA: Diagnosis not present

## 2022-09-28 DIAGNOSIS — L821 Other seborrheic keratosis: Secondary | ICD-10-CM | POA: Diagnosis not present

## 2022-09-28 DIAGNOSIS — Z85828 Personal history of other malignant neoplasm of skin: Secondary | ICD-10-CM | POA: Diagnosis not present

## 2022-09-28 DIAGNOSIS — L814 Other melanin hyperpigmentation: Secondary | ICD-10-CM | POA: Diagnosis not present

## 2022-09-28 DIAGNOSIS — L570 Actinic keratosis: Secondary | ICD-10-CM | POA: Diagnosis not present

## 2022-09-28 DIAGNOSIS — D225 Melanocytic nevi of trunk: Secondary | ICD-10-CM | POA: Diagnosis not present

## 2022-10-28 ENCOUNTER — Other Ambulatory Visit (HOSPITAL_COMMUNITY): Payer: Self-pay

## 2022-11-28 DIAGNOSIS — L570 Actinic keratosis: Secondary | ICD-10-CM | POA: Diagnosis not present

## 2023-03-03 DIAGNOSIS — M1712 Unilateral primary osteoarthritis, left knee: Secondary | ICD-10-CM | POA: Diagnosis not present

## 2023-03-03 DIAGNOSIS — M1711 Unilateral primary osteoarthritis, right knee: Secondary | ICD-10-CM | POA: Diagnosis not present

## 2023-03-03 DIAGNOSIS — M170 Bilateral primary osteoarthritis of knee: Secondary | ICD-10-CM | POA: Diagnosis not present

## 2023-03-23 ENCOUNTER — Other Ambulatory Visit (HOSPITAL_COMMUNITY): Payer: Self-pay

## 2023-03-29 DIAGNOSIS — M1712 Unilateral primary osteoarthritis, left knee: Secondary | ICD-10-CM | POA: Diagnosis not present

## 2023-03-29 DIAGNOSIS — M1711 Unilateral primary osteoarthritis, right knee: Secondary | ICD-10-CM | POA: Diagnosis not present

## 2023-03-29 DIAGNOSIS — M170 Bilateral primary osteoarthritis of knee: Secondary | ICD-10-CM | POA: Diagnosis not present

## 2023-04-04 DIAGNOSIS — D225 Melanocytic nevi of trunk: Secondary | ICD-10-CM | POA: Diagnosis not present

## 2023-04-04 DIAGNOSIS — L820 Inflamed seborrheic keratosis: Secondary | ICD-10-CM | POA: Diagnosis not present

## 2023-04-04 DIAGNOSIS — L570 Actinic keratosis: Secondary | ICD-10-CM | POA: Diagnosis not present

## 2023-04-04 DIAGNOSIS — L814 Other melanin hyperpigmentation: Secondary | ICD-10-CM | POA: Diagnosis not present

## 2023-04-04 DIAGNOSIS — L821 Other seborrheic keratosis: Secondary | ICD-10-CM | POA: Diagnosis not present

## 2023-04-04 DIAGNOSIS — Z85828 Personal history of other malignant neoplasm of skin: Secondary | ICD-10-CM | POA: Diagnosis not present

## 2023-04-04 DIAGNOSIS — L578 Other skin changes due to chronic exposure to nonionizing radiation: Secondary | ICD-10-CM | POA: Diagnosis not present

## 2023-04-12 DIAGNOSIS — M1712 Unilateral primary osteoarthritis, left knee: Secondary | ICD-10-CM | POA: Diagnosis not present

## 2023-04-19 DIAGNOSIS — M1712 Unilateral primary osteoarthritis, left knee: Secondary | ICD-10-CM | POA: Diagnosis not present

## 2023-05-08 DIAGNOSIS — N401 Enlarged prostate with lower urinary tract symptoms: Secondary | ICD-10-CM | POA: Diagnosis not present

## 2023-05-08 DIAGNOSIS — Z8546 Personal history of malignant neoplasm of prostate: Secondary | ICD-10-CM | POA: Diagnosis not present

## 2023-05-08 DIAGNOSIS — N5201 Erectile dysfunction due to arterial insufficiency: Secondary | ICD-10-CM | POA: Diagnosis not present

## 2023-05-08 DIAGNOSIS — R3914 Feeling of incomplete bladder emptying: Secondary | ICD-10-CM | POA: Diagnosis not present

## 2023-06-02 ENCOUNTER — Other Ambulatory Visit: Payer: Self-pay | Admitting: Internal Medicine

## 2023-06-02 DIAGNOSIS — Z8546 Personal history of malignant neoplasm of prostate: Secondary | ICD-10-CM | POA: Diagnosis not present

## 2023-06-02 DIAGNOSIS — R059 Cough, unspecified: Secondary | ICD-10-CM

## 2023-06-02 DIAGNOSIS — F331 Major depressive disorder, recurrent, moderate: Secondary | ICD-10-CM | POA: Diagnosis not present

## 2023-06-02 DIAGNOSIS — R7303 Prediabetes: Secondary | ICD-10-CM | POA: Diagnosis not present

## 2023-06-02 DIAGNOSIS — Z0000 Encounter for general adult medical examination without abnormal findings: Secondary | ICD-10-CM | POA: Diagnosis not present

## 2023-06-02 DIAGNOSIS — K219 Gastro-esophageal reflux disease without esophagitis: Secondary | ICD-10-CM | POA: Diagnosis not present

## 2023-06-02 DIAGNOSIS — M5136 Other intervertebral disc degeneration, lumbar region: Secondary | ICD-10-CM | POA: Diagnosis not present

## 2023-06-02 DIAGNOSIS — Z136 Encounter for screening for cardiovascular disorders: Secondary | ICD-10-CM | POA: Diagnosis not present

## 2023-06-02 DIAGNOSIS — Z1331 Encounter for screening for depression: Secondary | ICD-10-CM | POA: Diagnosis not present

## 2023-07-06 ENCOUNTER — Ambulatory Visit
Admission: RE | Admit: 2023-07-06 | Discharge: 2023-07-06 | Disposition: A | Discharge status: Home / Self Care | Payer: PPO | Source: Ambulatory Visit | Attending: Internal Medicine | Admitting: Internal Medicine

## 2023-07-06 DIAGNOSIS — Z8546 Personal history of malignant neoplasm of prostate: Secondary | ICD-10-CM | POA: Diagnosis not present

## 2023-07-06 DIAGNOSIS — R059 Cough, unspecified: Secondary | ICD-10-CM | POA: Diagnosis not present

## 2023-07-06 MED ORDER — IOPAMIDOL (ISOVUE-300) INJECTION 61%
75.0000 mL | Freq: Once | INTRAVENOUS | Status: AC | PRN
  Administered 2023-07-06: 75 mL via INTRAVENOUS

## 2023-10-02 DIAGNOSIS — L814 Other melanin hyperpigmentation: Secondary | ICD-10-CM | POA: Diagnosis not present

## 2023-10-02 DIAGNOSIS — D225 Melanocytic nevi of trunk: Secondary | ICD-10-CM | POA: Diagnosis not present

## 2023-10-02 DIAGNOSIS — Z85828 Personal history of other malignant neoplasm of skin: Secondary | ICD-10-CM | POA: Diagnosis not present

## 2023-10-02 DIAGNOSIS — L578 Other skin changes due to chronic exposure to nonionizing radiation: Secondary | ICD-10-CM | POA: Diagnosis not present

## 2023-10-02 DIAGNOSIS — L821 Other seborrheic keratosis: Secondary | ICD-10-CM | POA: Diagnosis not present

## 2023-10-02 DIAGNOSIS — L570 Actinic keratosis: Secondary | ICD-10-CM | POA: Diagnosis not present

## 2023-11-27 ENCOUNTER — Other Ambulatory Visit (HOSPITAL_COMMUNITY): Payer: Self-pay

## 2024-03-28 ENCOUNTER — Other Ambulatory Visit (HOSPITAL_COMMUNITY): Payer: Self-pay

## 2024-04-22 DIAGNOSIS — D485 Neoplasm of uncertain behavior of skin: Secondary | ICD-10-CM | POA: Diagnosis not present

## 2024-04-22 DIAGNOSIS — L821 Other seborrheic keratosis: Secondary | ICD-10-CM | POA: Diagnosis not present

## 2024-04-22 DIAGNOSIS — Z85828 Personal history of other malignant neoplasm of skin: Secondary | ICD-10-CM | POA: Diagnosis not present

## 2024-04-22 DIAGNOSIS — D045 Carcinoma in situ of skin of trunk: Secondary | ICD-10-CM | POA: Diagnosis not present

## 2024-04-22 DIAGNOSIS — D1800 Hemangioma unspecified site: Secondary | ICD-10-CM | POA: Diagnosis not present

## 2024-04-22 DIAGNOSIS — L578 Other skin changes due to chronic exposure to nonionizing radiation: Secondary | ICD-10-CM | POA: Diagnosis not present

## 2024-04-22 DIAGNOSIS — D0461 Carcinoma in situ of skin of right upper limb, including shoulder: Secondary | ICD-10-CM | POA: Diagnosis not present

## 2024-04-22 DIAGNOSIS — L570 Actinic keratosis: Secondary | ICD-10-CM | POA: Diagnosis not present

## 2024-04-22 DIAGNOSIS — L814 Other melanin hyperpigmentation: Secondary | ICD-10-CM | POA: Diagnosis not present

## 2024-04-22 DIAGNOSIS — D225 Melanocytic nevi of trunk: Secondary | ICD-10-CM | POA: Diagnosis not present

## 2024-05-08 DIAGNOSIS — Z8546 Personal history of malignant neoplasm of prostate: Secondary | ICD-10-CM | POA: Diagnosis not present

## 2024-05-08 DIAGNOSIS — N401 Enlarged prostate with lower urinary tract symptoms: Secondary | ICD-10-CM | POA: Diagnosis not present

## 2024-05-08 DIAGNOSIS — R350 Frequency of micturition: Secondary | ICD-10-CM | POA: Diagnosis not present

## 2024-05-08 DIAGNOSIS — N5201 Erectile dysfunction due to arterial insufficiency: Secondary | ICD-10-CM | POA: Diagnosis not present

## 2024-06-06 DIAGNOSIS — Z8546 Personal history of malignant neoplasm of prostate: Secondary | ICD-10-CM | POA: Diagnosis not present

## 2024-06-06 DIAGNOSIS — N400 Enlarged prostate without lower urinary tract symptoms: Secondary | ICD-10-CM | POA: Diagnosis not present

## 2024-06-06 DIAGNOSIS — M179 Osteoarthritis of knee, unspecified: Secondary | ICD-10-CM | POA: Diagnosis not present

## 2024-06-06 DIAGNOSIS — F331 Major depressive disorder, recurrent, moderate: Secondary | ICD-10-CM | POA: Diagnosis not present

## 2024-06-06 DIAGNOSIS — M48062 Spinal stenosis, lumbar region with neurogenic claudication: Secondary | ICD-10-CM | POA: Diagnosis not present

## 2024-06-06 DIAGNOSIS — Z1331 Encounter for screening for depression: Secondary | ICD-10-CM | POA: Diagnosis not present

## 2024-06-06 DIAGNOSIS — K219 Gastro-esophageal reflux disease without esophagitis: Secondary | ICD-10-CM | POA: Diagnosis not present

## 2024-06-06 DIAGNOSIS — R7303 Prediabetes: Secondary | ICD-10-CM | POA: Diagnosis not present

## 2024-06-06 DIAGNOSIS — Z23 Encounter for immunization: Secondary | ICD-10-CM | POA: Diagnosis not present

## 2024-06-06 DIAGNOSIS — Z860100 Personal history of colon polyps, unspecified: Secondary | ICD-10-CM | POA: Diagnosis not present

## 2024-06-06 DIAGNOSIS — Z0000 Encounter for general adult medical examination without abnormal findings: Secondary | ICD-10-CM | POA: Diagnosis not present

## 2024-08-13 DIAGNOSIS — Z86018 Personal history of other benign neoplasm: Secondary | ICD-10-CM | POA: Diagnosis not present

## 2024-08-13 DIAGNOSIS — K219 Gastro-esophageal reflux disease without esophagitis: Secondary | ICD-10-CM | POA: Diagnosis not present

## 2024-08-16 ENCOUNTER — Other Ambulatory Visit (HOSPITAL_COMMUNITY): Payer: Self-pay | Admitting: Internal Medicine

## 2024-08-16 DIAGNOSIS — R109 Unspecified abdominal pain: Secondary | ICD-10-CM

## 2024-08-16 DIAGNOSIS — M6530 Trigger finger, unspecified finger: Secondary | ICD-10-CM | POA: Diagnosis not present

## 2024-08-16 DIAGNOSIS — R1010 Upper abdominal pain, unspecified: Secondary | ICD-10-CM | POA: Diagnosis not present

## 2024-08-19 ENCOUNTER — Ambulatory Visit (HOSPITAL_BASED_OUTPATIENT_CLINIC_OR_DEPARTMENT_OTHER)
Admission: RE | Admit: 2024-08-19 | Discharge: 2024-08-19 | Disposition: A | Discharge status: Home / Self Care | Source: Ambulatory Visit | Attending: Internal Medicine | Admitting: Internal Medicine

## 2024-08-19 DIAGNOSIS — R109 Unspecified abdominal pain: Secondary | ICD-10-CM | POA: Insufficient documentation

## 2024-08-19 DIAGNOSIS — N200 Calculus of kidney: Secondary | ICD-10-CM | POA: Diagnosis not present

## 2024-08-19 DIAGNOSIS — N281 Cyst of kidney, acquired: Secondary | ICD-10-CM | POA: Diagnosis not present

## 2024-08-19 DIAGNOSIS — Q438 Other specified congenital malformations of intestine: Secondary | ICD-10-CM | POA: Diagnosis not present

## 2024-08-20 DIAGNOSIS — M65331 Trigger finger, right middle finger: Secondary | ICD-10-CM | POA: Diagnosis not present

## 2024-08-20 DIAGNOSIS — M79641 Pain in right hand: Secondary | ICD-10-CM | POA: Diagnosis not present

## 2024-08-29 DIAGNOSIS — K5730 Diverticulosis of large intestine without perforation or abscess without bleeding: Secondary | ICD-10-CM | POA: Diagnosis not present

## 2024-08-29 DIAGNOSIS — K648 Other hemorrhoids: Secondary | ICD-10-CM | POA: Diagnosis not present

## 2024-08-29 DIAGNOSIS — Z09 Encounter for follow-up examination after completed treatment for conditions other than malignant neoplasm: Secondary | ICD-10-CM | POA: Diagnosis not present

## 2024-08-29 DIAGNOSIS — Z860101 Personal history of adenomatous and serrated colon polyps: Secondary | ICD-10-CM | POA: Diagnosis not present

## 2024-09-10 DIAGNOSIS — D485 Neoplasm of uncertain behavior of skin: Secondary | ICD-10-CM | POA: Diagnosis not present

## 2024-09-10 DIAGNOSIS — C44529 Squamous cell carcinoma of skin of other part of trunk: Secondary | ICD-10-CM | POA: Diagnosis not present

## 2024-09-23 DIAGNOSIS — R0781 Pleurodynia: Secondary | ICD-10-CM | POA: Diagnosis not present

## 2024-09-23 DIAGNOSIS — M48062 Spinal stenosis, lumbar region with neurogenic claudication: Secondary | ICD-10-CM | POA: Diagnosis not present

## 2024-09-24 DIAGNOSIS — M51369 Other intervertebral disc degeneration, lumbar region without mention of lumbar back pain or lower extremity pain: Secondary | ICD-10-CM | POA: Diagnosis not present

## 2024-09-24 DIAGNOSIS — M47816 Spondylosis without myelopathy or radiculopathy, lumbar region: Secondary | ICD-10-CM | POA: Diagnosis not present

## 2024-09-24 DIAGNOSIS — M4316 Spondylolisthesis, lumbar region: Secondary | ICD-10-CM | POA: Diagnosis not present

## 2024-09-24 DIAGNOSIS — R0781 Pleurodynia: Secondary | ICD-10-CM | POA: Diagnosis not present

## 2024-09-24 DIAGNOSIS — M48062 Spinal stenosis, lumbar region with neurogenic claudication: Secondary | ICD-10-CM | POA: Diagnosis not present

## 2024-10-10 DIAGNOSIS — L905 Scar conditions and fibrosis of skin: Secondary | ICD-10-CM | POA: Diagnosis not present

## 2024-10-10 DIAGNOSIS — C44529 Squamous cell carcinoma of skin of other part of trunk: Secondary | ICD-10-CM | POA: Diagnosis not present

## 2024-10-22 ENCOUNTER — Other Ambulatory Visit: Payer: Self-pay

## 2024-10-22 DIAGNOSIS — D1800 Hemangioma unspecified site: Secondary | ICD-10-CM | POA: Diagnosis not present

## 2024-10-22 DIAGNOSIS — L578 Other skin changes due to chronic exposure to nonionizing radiation: Secondary | ICD-10-CM | POA: Diagnosis not present

## 2024-10-22 DIAGNOSIS — Z85828 Personal history of other malignant neoplasm of skin: Secondary | ICD-10-CM | POA: Diagnosis not present

## 2024-10-22 DIAGNOSIS — L814 Other melanin hyperpigmentation: Secondary | ICD-10-CM | POA: Diagnosis not present

## 2024-10-22 DIAGNOSIS — L821 Other seborrheic keratosis: Secondary | ICD-10-CM | POA: Diagnosis not present

## 2024-10-22 DIAGNOSIS — D225 Melanocytic nevi of trunk: Secondary | ICD-10-CM | POA: Diagnosis not present

## 2024-10-22 DIAGNOSIS — Z4802 Encounter for removal of sutures: Secondary | ICD-10-CM | POA: Diagnosis not present

## 2024-10-22 DIAGNOSIS — L570 Actinic keratosis: Secondary | ICD-10-CM | POA: Diagnosis not present

## 2024-10-22 MED ORDER — FLUOROURACIL 5 % EX CREA
1.0000 | TOPICAL_CREAM | Freq: Two times a day (BID) | CUTANEOUS | 0 refills | Status: AC
  Filled 2024-10-22: qty 40, 30d supply, fill #0

## 2024-10-24 ENCOUNTER — Other Ambulatory Visit (HOSPITAL_COMMUNITY): Payer: Self-pay

## 2024-10-25 ENCOUNTER — Other Ambulatory Visit: Payer: Self-pay
